# Patient Record
Sex: Male | Born: 1940 | Race: White | Hispanic: No | Smoking: Former smoker
Health system: Southern US, Community
[De-identification: ages and names within clinical notes are randomized; demographics above are authoritative.]

## PROBLEM LIST (undated history)

## (undated) DIAGNOSIS — F419 Anxiety disorder, unspecified: Secondary | ICD-10-CM

## (undated) DIAGNOSIS — I10 Essential (primary) hypertension: Secondary | ICD-10-CM

## (undated) DIAGNOSIS — E119 Type 2 diabetes mellitus without complications: Secondary | ICD-10-CM

## (undated) DIAGNOSIS — M109 Gout, unspecified: Secondary | ICD-10-CM

## (undated) DIAGNOSIS — C801 Malignant (primary) neoplasm, unspecified: Secondary | ICD-10-CM

## (undated) HISTORY — PX: NO PAST SURGERIES: SHX2092

## (undated) HISTORY — PX: MOLE REMOVAL: SHX2046

---

## 1998-08-17 ENCOUNTER — Ambulatory Visit (HOSPITAL_COMMUNITY): Admission: RE | Admit: 1998-08-17 | Discharge: 1998-08-17 | Payer: Self-pay | Admitting: Family Medicine

## 2001-02-05 ENCOUNTER — Encounter (INDEPENDENT_AMBULATORY_CARE_PROVIDER_SITE_OTHER): Payer: Self-pay | Admitting: *Deleted

## 2001-02-05 ENCOUNTER — Ambulatory Visit (HOSPITAL_COMMUNITY): Admission: RE | Admit: 2001-02-05 | Discharge: 2001-02-05 | Payer: Self-pay | Admitting: *Deleted

## 2004-03-15 ENCOUNTER — Encounter (INDEPENDENT_AMBULATORY_CARE_PROVIDER_SITE_OTHER): Payer: Self-pay | Admitting: *Deleted

## 2004-03-15 ENCOUNTER — Ambulatory Visit (HOSPITAL_COMMUNITY): Admission: RE | Admit: 2004-03-15 | Discharge: 2004-03-15 | Payer: Self-pay | Admitting: *Deleted

## 2009-08-16 ENCOUNTER — Ambulatory Visit (HOSPITAL_COMMUNITY): Admission: RE | Admit: 2009-08-16 | Discharge: 2009-08-16 | Payer: Self-pay | Admitting: Gastroenterology

## 2010-05-09 ENCOUNTER — Encounter: Admission: RE | Admit: 2010-05-09 | Discharge: 2010-05-09 | Payer: Self-pay | Admitting: Family Medicine

## 2010-10-22 LAB — GLUCOSE, CAPILLARY: Glucose-Capillary: 125 mg/dL — ABNORMAL HIGH (ref 70–99)

## 2016-04-17 ENCOUNTER — Other Ambulatory Visit: Payer: Self-pay | Admitting: Family Medicine

## 2016-04-18 ENCOUNTER — Other Ambulatory Visit: Payer: Self-pay | Admitting: Family Medicine

## 2016-04-19 ENCOUNTER — Other Ambulatory Visit: Payer: Self-pay | Admitting: Family Medicine

## 2016-04-19 DIAGNOSIS — Z136 Encounter for screening for cardiovascular disorders: Secondary | ICD-10-CM

## 2016-05-01 ENCOUNTER — Ambulatory Visit
Admission: RE | Admit: 2016-05-01 | Discharge: 2016-05-01 | Disposition: A | Discharge status: Home / Self Care | Payer: Medicare Other | Source: Ambulatory Visit | Attending: Family Medicine | Admitting: Family Medicine

## 2016-05-01 DIAGNOSIS — Z136 Encounter for screening for cardiovascular disorders: Secondary | ICD-10-CM

## 2016-11-22 ENCOUNTER — Observation Stay (HOSPITAL_COMMUNITY)
Admission: EM | Admit: 2016-11-22 | Discharge: 2016-11-23 | Disposition: A | Discharge status: Home / Self Care | Payer: Medicare Other | Attending: Internal Medicine | Admitting: Internal Medicine

## 2016-11-22 ENCOUNTER — Emergency Department (HOSPITAL_COMMUNITY): Payer: Medicare Other

## 2016-11-22 ENCOUNTER — Encounter (HOSPITAL_COMMUNITY): Payer: Self-pay | Admitting: Emergency Medicine

## 2016-11-22 DIAGNOSIS — I208 Other forms of angina pectoris: Principal | ICD-10-CM | POA: Insufficient documentation

## 2016-11-22 DIAGNOSIS — Z79899 Other long term (current) drug therapy: Secondary | ICD-10-CM | POA: Insufficient documentation

## 2016-11-22 DIAGNOSIS — F419 Anxiety disorder, unspecified: Secondary | ICD-10-CM | POA: Diagnosis not present

## 2016-11-22 DIAGNOSIS — Z87891 Personal history of nicotine dependence: Secondary | ICD-10-CM | POA: Insufficient documentation

## 2016-11-22 DIAGNOSIS — E119 Type 2 diabetes mellitus without complications: Secondary | ICD-10-CM | POA: Diagnosis not present

## 2016-11-22 DIAGNOSIS — R079 Chest pain, unspecified: Secondary | ICD-10-CM

## 2016-11-22 DIAGNOSIS — R0789 Other chest pain: Secondary | ICD-10-CM | POA: Diagnosis present

## 2016-11-22 DIAGNOSIS — Z7982 Long term (current) use of aspirin: Secondary | ICD-10-CM | POA: Diagnosis not present

## 2016-11-22 DIAGNOSIS — I10 Essential (primary) hypertension: Secondary | ICD-10-CM | POA: Diagnosis not present

## 2016-11-22 DIAGNOSIS — Z7984 Long term (current) use of oral hypoglycemic drugs: Secondary | ICD-10-CM | POA: Diagnosis not present

## 2016-11-22 DIAGNOSIS — M1A9XX0 Chronic gout, unspecified, without tophus (tophi): Secondary | ICD-10-CM | POA: Diagnosis not present

## 2016-11-22 DIAGNOSIS — N183 Chronic kidney disease, stage 3 (moderate): Secondary | ICD-10-CM | POA: Diagnosis not present

## 2016-11-22 DIAGNOSIS — E1122 Type 2 diabetes mellitus with diabetic chronic kidney disease: Secondary | ICD-10-CM

## 2016-11-22 DIAGNOSIS — M109 Gout, unspecified: Secondary | ICD-10-CM | POA: Diagnosis present

## 2016-11-22 HISTORY — DX: Essential (primary) hypertension: I10

## 2016-11-22 HISTORY — DX: Gout, unspecified: M10.9

## 2016-11-22 HISTORY — DX: Type 2 diabetes mellitus without complications: E11.9

## 2016-11-22 HISTORY — DX: Anxiety disorder, unspecified: F41.9

## 2016-11-22 LAB — COMPREHENSIVE METABOLIC PANEL
ALBUMIN: 4 g/dL (ref 3.5–5.0)
ALT: 28 U/L (ref 17–63)
AST: 26 U/L (ref 15–41)
Alkaline Phosphatase: 63 U/L (ref 38–126)
Anion gap: 12 (ref 5–15)
BILIRUBIN TOTAL: 0.8 mg/dL (ref 0.3–1.2)
BUN: 25 mg/dL — AB (ref 6–20)
CHLORIDE: 103 mmol/L (ref 101–111)
CO2: 21 mmol/L — ABNORMAL LOW (ref 22–32)
CREATININE: 1.68 mg/dL — AB (ref 0.61–1.24)
Calcium: 9.4 mg/dL (ref 8.9–10.3)
GFR calc Af Amer: 45 mL/min — ABNORMAL LOW (ref >60)
GFR, EST NON AFRICAN AMERICAN: 38 mL/min — AB (ref >60)
GLUCOSE: 174 mg/dL — AB (ref 65–99)
Potassium: 3.6 mmol/L (ref 3.5–5.1)
Sodium: 136 mmol/L (ref 135–145)
Total Protein: 6.5 g/dL (ref 6.5–8.1)

## 2016-11-22 LAB — I-STAT TROPONIN, ED: TROPONIN I, POC: 0 ng/mL (ref 0.00–0.08)

## 2016-11-22 LAB — CBC WITH DIFFERENTIAL/PLATELET
BASOS ABS: 0 10*3/uL (ref 0.0–0.1)
Basophils Relative: 0 %
Eosinophils Absolute: 0 10*3/uL (ref 0.0–0.7)
Eosinophils Relative: 0 %
HEMATOCRIT: 40.7 % (ref 39.0–52.0)
Hemoglobin: 13.5 g/dL (ref 13.0–17.0)
LYMPHS PCT: 13 %
Lymphs Abs: 1.2 10*3/uL (ref 0.7–4.0)
MCH: 28.5 pg (ref 26.0–34.0)
MCHC: 33.2 g/dL (ref 30.0–36.0)
MCV: 85.9 fL (ref 78.0–100.0)
Monocytes Absolute: 0.3 10*3/uL (ref 0.1–1.0)
Monocytes Relative: 4 %
NEUTROS ABS: 7.3 10*3/uL (ref 1.7–7.7)
NEUTROS PCT: 83 %
Platelets: 198 10*3/uL (ref 150–400)
RBC: 4.74 MIL/uL (ref 4.22–5.81)
RDW: 14.7 % (ref 11.5–15.5)
WBC: 8.8 10*3/uL (ref 4.0–10.5)

## 2016-11-22 NOTE — ED Provider Notes (Signed)
[facility] DEPT Provider Note   CSN: [id number] Arrival date & time: 11/22/16  2109     History   Chief Complaint Chief Complaint  Patient presents with  . Chest Pain    HPI Bryan Lloyd is a 76 y.o. male.  Patient states that he had chest pressure for 4 hours today with sweating and weakness. He states he never had this before and feels okay now   The history is provided by the patient. No language interpreter was used.  Chest Pain   This is a new problem. The current episode started 6 to 12 hours ago. The problem occurs rarely. The problem has been resolved. Associated with: Unknown. The pain is at a severity of 5/10. The pain is moderate. The quality of the pain is described as dull. Pertinent negatives include no abdominal pain, no back pain, no cough and no headaches.  Pertinent negatives for past medical history include no seizures.    Past Medical History:  Diagnosis Date  . Anxiety     There are no active problems to display for this patient.   History reviewed. No pertinent surgical history.     Home Medications    Prior to Admission medications   Medication Sig Start Date End Date Taking? Authorizing Provider  allopurinol (ZYLOPRIM) 300 MG tablet Take 300 mg by mouth daily.   Yes Historical Provider, MD  amLODipine (NORVASC) 10 MG tablet Take 10 mg by mouth daily.   Yes Historical Provider, MD  aspirin EC 81 MG tablet Take 81 mg by mouth daily.   Yes Historical Provider, MD  colchicine (COLCRYS) 0.6 MG tablet Take 0.6 mg by mouth daily as needed (for gout flares).   Yes Historical Provider, MD  escitalopram (LEXAPRO) 10 MG tablet Take 10 mg by mouth daily.   Yes Historical Provider, MD  glimepiride (AMARYL) 2 MG tablet Take 2 mg by mouth daily with breakfast.   Yes Historical Provider, MD  indapamide (LOZOL) 2.5 MG tablet Take 2.5 mg by mouth daily.   Yes Historical Provider, MD  LORazepam (ATIVAN) 0.5 MG tablet Take 0.5 mg by mouth daily as needed for  anxiety.   Yes Historical Provider, MD  metFORMIN (GLUCOPHAGE) 1000 MG tablet Take 1,000 mg by mouth 2 (two) times daily.   Yes Historical Provider, MD  metoprolol succinate (TOPROL-XL) 50 MG 24 hr tablet Take 50 mg by mouth daily. Take with or immediately following a meal.   Yes Historical Provider, MD  omeprazole (PRILOSEC) 20 MG capsule Take 20 mg by mouth daily before breakfast.   Yes Historical Provider, MD  pioglitazone (ACTOS) 30 MG tablet Take 30 mg by mouth daily.   Yes Historical Provider, MD  sildenafil (REVATIO) 20 MG tablet Take 20 mg by mouth daily as needed. AS DIRECTED    Historical Provider, MD    Family History No family history on file.  Social History Social History  Substance Use Topics  . Smoking status: Former Research scientist (life sciences)  . Smokeless tobacco: Never Used  . Alcohol use 0.6 oz/week    1 Cans of beer per week     Allergies   Patient has no known allergies.   Review of Systems Review of Systems  Constitutional: Negative for appetite change and fatigue.  HENT: Negative for congestion, ear discharge and sinus pressure.   Eyes: Negative for discharge.  Respiratory: Negative for cough.   Cardiovascular: Positive for chest pain.  Gastrointestinal: Negative for abdominal pain and diarrhea.  Genitourinary: Negative for  frequency and hematuria.  Musculoskeletal: Negative for back pain.  Skin: Negative for rash.  Neurological: Negative for seizures and headaches.  Psychiatric/Behavioral: Negative for hallucinations.     Physical Exam Updated Vital Signs BP 140/71   Pulse 76   Temp 97.9 F (36.6 C) (Oral)   Resp 13   Ht 5\' 6"  (1.676 m)   Wt 147 lb (66.7 kg)   SpO2 98%   BMI 23.73 kg/m   Physical Exam  Constitutional: He is oriented to person, place, and time. He appears well-developed.  HENT:  Head: Normocephalic.  Eyes: Conjunctivae and EOM are normal. No scleral icterus.  Neck: Neck supple. No thyromegaly present.  Cardiovascular: Normal rate and  regular rhythm.  Exam reveals no gallop and no friction rub.   No murmur heard. Pulmonary/Chest: No stridor. He has no wheezes. He has no rales. He exhibits no tenderness.  Abdominal: He exhibits no distension. There is no tenderness. There is no rebound.  Musculoskeletal: Normal range of motion. He exhibits no edema.  Lymphadenopathy:    He has no cervical adenopathy.  Neurological: He is oriented to person, place, and time. He exhibits normal muscle tone. Coordination normal.  Skin: No rash noted. No erythema.  Psychiatric: He has a normal mood and affect. His behavior is normal.     ED Treatments / Results  Labs (all labs ordered are listed, but only abnormal results are displayed) Labs Reviewed  COMPREHENSIVE METABOLIC PANEL - Abnormal; Notable for the following:       Result Value   CO2 21 (*)    Glucose, Bld 174 (*)    BUN 25 (*)    Creatinine, Ser 1.68 (*)    GFR calc non Af Amer 38 (*)    GFR calc Af Amer 45 (*)    All other components within normal limits  CBC WITH DIFFERENTIAL/PLATELET  I-STAT TROPOININ, ED    EKG  EKG Interpretation  Date/Time:  Thursday November 22 2016 21:18:51 EDT Ventricular Rate:  75 PR Interval:    QRS Duration: 99 QT Interval:  357 QTC Calculation: 399 R Axis:   76 Text Interpretation:  Sinus rhythm Ventricular premature complex Borderline T wave abnormalities Confirmed by Diedre Maclellan  MD, Broadus John [phone]) on 11/22/2016 9:38:02 PM Also confirmed by Jerika Wales  MD, Broadus John [phone])  on 11/22/2016 11:14:46 PM       Radiology Dg Chest 2 View  Result Date: 11/22/2016 CLINICAL DATA:  Central chest pain, shortness of breath, nausea and vomiting for 1 day. Former smoker. EXAM: CHEST  2 VIEW COMPARISON:  None. FINDINGS: Normal heart size and pulmonary vascularity. No focal airspace disease or consolidation in the lungs. No blunting of costophrenic angles. No pneumothorax. Mediastinal contours appear intact. Degenerative changes in the spine and shoulders.  IMPRESSION: No active cardiopulmonary disease. Electronically Signed   By: Lucienne Capers M.D.   On: 11/22/2016 22:16    Procedures Procedures (including critical care time)  Medications Ordered in ED Medications - No data to display   Initial Impression / Assessment and Plan / ED Course  I have reviewed the triage vital signs and the nursing notes.  Pertinent labs & imaging results that were available during my care of the patient were reviewed by me and considered in my medical decision making (see chart for details).     Patient with chest pain today for 4 hours. Labs unremarkable. Patient will be admitted for further evaluation  Final Clinical Impressions(s) / ED Diagnoses   Final diagnoses:  Essential hypertension  Stable angina pectoris [facility])    New Prescriptions New Prescriptions   No medications on file     Milton Ferguson, MD 11/22/16 2351

## 2016-11-22 NOTE — ED Triage Notes (Signed)
Per EMS: pt changed Mx recently. Pt states that today her got a tightness in his chest, tingling in both arms, N/V and dizziness. Pt took 3x81mg  of ASP today but vomited them up.    VSS NSR A&Ox4

## 2016-11-23 ENCOUNTER — Observation Stay (HOSPITAL_BASED_OUTPATIENT_CLINIC_OR_DEPARTMENT_OTHER): Payer: Medicare Other

## 2016-11-23 ENCOUNTER — Encounter (HOSPITAL_COMMUNITY): Payer: Self-pay | Admitting: Internal Medicine

## 2016-11-23 DIAGNOSIS — I208 Other forms of angina pectoris: Secondary | ICD-10-CM | POA: Diagnosis not present

## 2016-11-23 DIAGNOSIS — R079 Chest pain, unspecified: Secondary | ICD-10-CM

## 2016-11-23 DIAGNOSIS — Z7982 Long term (current) use of aspirin: Secondary | ICD-10-CM | POA: Diagnosis not present

## 2016-11-23 DIAGNOSIS — M109 Gout, unspecified: Secondary | ICD-10-CM | POA: Diagnosis present

## 2016-11-23 DIAGNOSIS — I10 Essential (primary) hypertension: Secondary | ICD-10-CM | POA: Diagnosis not present

## 2016-11-23 DIAGNOSIS — E0800 Diabetes mellitus due to underlying condition with hyperosmolarity without nonketotic hyperglycemic-hyperosmolar coma (NKHHC): Secondary | ICD-10-CM | POA: Diagnosis not present

## 2016-11-23 DIAGNOSIS — E119 Type 2 diabetes mellitus without complications: Secondary | ICD-10-CM | POA: Diagnosis not present

## 2016-11-23 DIAGNOSIS — F419 Anxiety disorder, unspecified: Secondary | ICD-10-CM | POA: Diagnosis present

## 2016-11-23 LAB — CBC
HCT: 39 % (ref 39.0–52.0)
HEMOGLOBIN: 12.9 g/dL — AB (ref 13.0–17.0)
MCH: 28.3 pg (ref 26.0–34.0)
MCHC: 33.1 g/dL (ref 30.0–36.0)
MCV: 85.5 fL (ref 78.0–100.0)
PLATELETS: 228 10*3/uL (ref 150–400)
RBC: 4.56 MIL/uL (ref 4.22–5.81)
RDW: 14.4 % (ref 11.5–15.5)
WBC: 7.8 10*3/uL (ref 4.0–10.5)

## 2016-11-23 LAB — BASIC METABOLIC PANEL
ANION GAP: 10 (ref 5–15)
BUN: 21 mg/dL — ABNORMAL HIGH (ref 6–20)
CALCIUM: 9.5 mg/dL (ref 8.9–10.3)
CO2: 25 mmol/L (ref 22–32)
Chloride: 105 mmol/L (ref 101–111)
Creatinine, Ser: 1.46 mg/dL — ABNORMAL HIGH (ref 0.61–1.24)
GFR, EST AFRICAN AMERICAN: 53 mL/min — AB (ref >60)
GFR, EST NON AFRICAN AMERICAN: 46 mL/min — AB (ref >60)
Glucose, Bld: 94 mg/dL (ref 65–99)
POTASSIUM: 3.6 mmol/L (ref 3.5–5.1)
SODIUM: 140 mmol/L (ref 135–145)

## 2016-11-23 LAB — GLUCOSE, CAPILLARY
GLUCOSE-CAPILLARY: 179 mg/dL — AB (ref 65–99)
GLUCOSE-CAPILLARY: 101 mg/dL — AB (ref 65–99)
GLUCOSE-CAPILLARY: 127 mg/dL — AB (ref 65–99)
Glucose-Capillary: 134 mg/dL — ABNORMAL HIGH (ref 65–99)
Glucose-Capillary: 156 mg/dL — ABNORMAL HIGH (ref 65–99)

## 2016-11-23 LAB — CREATININE, SERUM
CREATININE: 1.55 mg/dL — AB (ref 0.61–1.24)
GFR calc Af Amer: 49 mL/min — ABNORMAL LOW (ref >60)
GFR, EST NON AFRICAN AMERICAN: 42 mL/min — AB (ref >60)

## 2016-11-23 LAB — NM MYOCAR MULTI W/SPECT W/WALL MOTION / EF
CHL CUP MPHR: 146 {beats}/min
CHL CUP NUCLEAR SDS: 3
CHL CUP NUCLEAR SRS: 3
CHL CUP RESTING HR STRESS: 69 {beats}/min
CSEPPHR: 96 {beats}/min
LHR: 0.35
LV dias vol: 55 mL (ref 62–150)
LV sys vol: 12 mL
Percent HR: 65 %
SSS: 6
TID: 1.22

## 2016-11-23 LAB — LIPID PANEL
Cholesterol: 104 mg/dL (ref 0–200)
HDL: 44 mg/dL (ref >40)
LDL CALC: 52 mg/dL (ref 0–99)
TRIGLYCERIDES: 39 mg/dL (ref <150)
Total CHOL/HDL Ratio: 2.4 RATIO
VLDL: 8 mg/dL (ref 0–40)

## 2016-11-23 LAB — TROPONIN I

## 2016-11-23 LAB — MAGNESIUM
MAGNESIUM: 1.1 mg/dL — AB (ref 1.7–2.4)
Magnesium: 1.4 mg/dL — ABNORMAL LOW (ref 1.7–2.4)

## 2016-11-23 MED ORDER — COLCHICINE 0.6 MG PO TABS: 0.6000 mg | ORAL_TABLET | Freq: Every day | ORAL | 1 refills | Status: AC | PRN

## 2016-11-23 MED ORDER — PANTOPRAZOLE SODIUM 40 MG PO TBEC
40.0000 mg | DELAYED_RELEASE_TABLET | Freq: Every day | ORAL | Status: DC
  Administered 2016-11-23: 40 mg via ORAL
  Filled 2016-11-23: qty 1

## 2016-11-23 MED ORDER — INSULIN ASPART 100 UNIT/ML ~~LOC~~ SOLN: 0.0000 [IU] | SUBCUTANEOUS | Status: DC

## 2016-11-23 MED ORDER — ALLOPURINOL 300 MG PO TABS
300.0000 mg | ORAL_TABLET | Freq: Every day | ORAL | Status: DC
  Administered 2016-11-23: 300 mg via ORAL
  Filled 2016-11-23: qty 1

## 2016-11-23 MED ORDER — REGADENOSON 0.4 MG/5ML IV SOLN
INTRAVENOUS | Status: AC
  Filled 2016-11-23: qty 5

## 2016-11-23 MED ORDER — METOPROLOL SUCCINATE ER 50 MG PO TB24
50.0000 mg | ORAL_TABLET | Freq: Every day | ORAL | Status: DC
  Administered 2016-11-23: 50 mg via ORAL
  Filled 2016-11-23: qty 1

## 2016-11-23 MED ORDER — REGADENOSON 0.4 MG/5ML IV SOLN
0.4000 mg | Freq: Once | INTRAVENOUS | Status: AC
  Administered 2016-11-23: 0.4 mg via INTRAVENOUS
  Filled 2016-11-23: qty 5

## 2016-11-23 MED ORDER — GI COCKTAIL ~~LOC~~: 30.0000 mL | Freq: Four times a day (QID) | ORAL | Status: DC | PRN

## 2016-11-23 MED ORDER — AMLODIPINE BESYLATE 10 MG PO TABS
10.0000 mg | ORAL_TABLET | Freq: Every day | ORAL | Status: DC
  Administered 2016-11-23: 10 mg via ORAL
  Filled 2016-11-23: qty 1

## 2016-11-23 MED ORDER — ONDANSETRON HCL 4 MG/2ML IJ SOLN
4.0000 mg | Freq: Four times a day (QID) | INTRAMUSCULAR | Status: DC | PRN
Start: 2016-11-23 — End: 2016-11-23

## 2016-11-23 MED ORDER — SODIUM CHLORIDE 0.9 % IV SOLN
INTRAVENOUS | Status: AC
  Administered 2016-11-23: 02:00:00 via INTRAVENOUS

## 2016-11-23 MED ORDER — MAGNESIUM SULFATE 2 GM/50ML IV SOLN: 2.0000 g | Freq: Once | INTRAVENOUS | Status: DC

## 2016-11-23 MED ORDER — INSULIN ASPART 100 UNIT/ML ~~LOC~~ SOLN
0.0000 [IU] | SUBCUTANEOUS | Status: DC
  Administered 2016-11-23: 2 [IU] via SUBCUTANEOUS

## 2016-11-23 MED ORDER — MAGNESIUM OXIDE 400 (241.3 MG) MG PO TABS: 400.0000 mg | ORAL_TABLET | Freq: Two times a day (BID) | ORAL | 2 refills | Status: DC

## 2016-11-23 MED ORDER — LORAZEPAM 0.5 MG PO TABS
0.5000 mg | ORAL_TABLET | Freq: Every day | ORAL | Status: DC | PRN
  Administered 2016-11-23: 0.5 mg via ORAL
  Filled 2016-11-23: qty 1

## 2016-11-23 MED ORDER — TECHNETIUM TC 99M TETROFOSMIN IV KIT
10.0000 | PACK | Freq: Once | INTRAVENOUS | Status: AC | PRN
  Administered 2016-11-23: 10 via INTRAVENOUS

## 2016-11-23 MED ORDER — MORPHINE SULFATE (PF) 2 MG/ML IV SOLN: 2.0000 mg | INTRAVENOUS | Status: DC | PRN

## 2016-11-23 MED ORDER — ACETAMINOPHEN 325 MG PO TABS: 650.0000 mg | ORAL_TABLET | ORAL | Status: DC | PRN

## 2016-11-23 MED ORDER — ENOXAPARIN SODIUM 30 MG/0.3ML ~~LOC~~ SOLN
30.0000 mg | SUBCUTANEOUS | Status: DC
  Filled 2016-11-23: qty 0.3

## 2016-11-23 MED ORDER — MAGNESIUM OXIDE 400 (241.3 MG) MG PO TABS
400.0000 mg | ORAL_TABLET | Freq: Two times a day (BID) | ORAL | Status: DC
Start: 2016-11-23 — End: 2016-11-23
  Administered 2016-11-23: 400 mg via ORAL
  Filled 2016-11-23: qty 1

## 2016-11-23 MED ORDER — ASPIRIN EC 81 MG PO TBEC
81.0000 mg | DELAYED_RELEASE_TABLET | Freq: Every day | ORAL | Status: DC
  Administered 2016-11-23: 81 mg via ORAL
  Filled 2016-11-23: qty 1

## 2016-11-23 MED ORDER — INDAPAMIDE 2.5 MG PO TABS: 2.5000 mg | ORAL_TABLET | Freq: Every day | ORAL | 1 refills | Status: AC

## 2016-11-23 MED ORDER — TECHNETIUM TC 99M TETROFOSMIN IV KIT
30.0000 | PACK | Freq: Once | INTRAVENOUS | Status: AC | PRN
  Administered 2016-11-23: 30 via INTRAVENOUS

## 2016-11-23 NOTE — Progress Notes (Signed)
   Bryan Lloyd presented for a nuclear stress test today.  No immediate complications.  Stress imaging is pending at this time.  Preliminary EKG findings may be listed in the chart, but the stress test result will not be finalized until perfusion imaging is complete.  Tami Lin Suman Trivedi, PA-C 11/23/2016, 1:28 PM

## 2016-11-23 NOTE — Discharge Summary (Addendum)
Physician Discharge Summary  Bryan Lloyd MRN: [id number] DOB/AGE: [birth year] 74 y.o.  PCP: No PCP Per Patient   Admit date: 11/22/2016 Discharge date: 11/23/2016  Discharge Diagnoses:    Active Problems:   Chest pain at rest   HTN (hypertension)   Gout   Diabetes mellitus (HCC)   Anxiety   Stable angina pectoris (HCC)    Follow-up recommendations Follow-up with PCP in 3-5 days , including all  additional recommended appointments as below Follow-up CBC, CMP, magnesium in 3-5 days Metformin discontinued secondary to elevated creatinine Patient to discuss with PCP about continuation of lexapro       Current Discharge Medication List    START taking these medications   Details  magnesium oxide (MAG-OX) 400 (241.3 Mg) MG tablet Take 1 tablet (400 mg total) by mouth 2 (two) times daily. Qty: 60 tablet, Refills: 2      CONTINUE these medications which have NOT CHANGED   Details  allopurinol (ZYLOPRIM) 300 MG tablet Take 300 mg by mouth daily.    amLODipine (NORVASC) 10 MG tablet Take 10 mg by mouth daily.    aspirin EC 81 MG tablet Take 81 mg by mouth daily.    colchicine (COLCRYS) 0.6 MG tablet Take 0.6 mg by mouth daily as needed (for gout flares).    escitalopram (LEXAPRO) 10 MG tablet Take 10 mg by mouth daily.    glimepiride (AMARYL) 2 MG tablet Take 2 mg by mouth daily with breakfast.    indapamide (LOZOL) 2.5 MG tablet Take 2.5 mg by mouth daily.    LORazepam (ATIVAN) 0.5 MG tablet Take 0.5 mg by mouth daily as needed for anxiety.    metoprolol succinate (TOPROL-XL) 50 MG 24 hr tablet Take 50 mg by mouth daily. Take with or immediately following a meal.    omeprazole (PRILOSEC) 20 MG capsule Take 20 mg by mouth daily before breakfast.    pioglitazone (ACTOS) 30 MG tablet Take 30 mg by mouth daily.    sildenafil (REVATIO) 20 MG tablet Take 20 mg by mouth daily as needed. AS DIRECTED      STOP taking these medications     metFORMIN (GLUCOPHAGE) 1000 MG  tablet          Discharge Condition: Stable   Discharge Instructions Get Medicines reviewed and adjusted: Please take all your medications with you for your next visit with your Primary MD  Please request your Primary MD to go over all hospital tests and procedure/radiological results at the follow up, please ask your Primary MD to get all Hospital records sent to his/her office.  If you experience worsening of your admission symptoms, develop shortness of breath, life threatening emergency, suicidal or homicidal thoughts you must seek medical attention immediately by calling 911 or calling your MD immediately if symptoms less severe.  You must read complete instructions/literature along with all the possible adverse reactions/side effects for all the Medicines you take and that have been prescribed to you. Take any new Medicines after you have completely understood and accpet all the possible adverse reactions/side effects.   Do not drive when taking Pain medications.   Do not take more than prescribed Pain, Sleep and Anxiety Medications  Special Instructions: If you have smoked or chewed Tobacco in the last 2 yrs please stop smoking, stop any regular Alcohol and or any Recreational drug use.  Wear Seat belts while driving.  Please note  You were cared for by a hospitalist during your hospital stay. Once  you are discharged, your primary care physician will handle any further medical issues. Please note that NO REFILLS for any discharge medications will be authorized once you are discharged, as it is imperative that you return to your primary care physician (or establish a relationship with a primary care physician if you do not have one) for your aftercare needs so that they can reassess your need for medications and monitor your lab values.     No Known Allergies    Disposition:     Consults: Cardiology     Significant Diagnostic Studies:  Dg Chest 2 View  Result  Date: 11/22/2016 CLINICAL DATA:  Central chest pain, shortness of breath, nausea and vomiting for 1 day. Former smoker. EXAM: CHEST  2 VIEW COMPARISON:  None. FINDINGS: Normal heart size and pulmonary vascularity. No focal airspace disease or consolidation in the lungs. No blunting of costophrenic angles. No pneumothorax. Mediastinal contours appear intact. Degenerative changes in the spine and shoulders. IMPRESSION: No active cardiopulmonary disease. Electronically Signed   By: Lucienne Capers M.D.   On: 11/22/2016 22:16        Filed Weights   11/22/16 2115 11/23/16 0057  Weight: 66.7 kg (147 lb) 65.7 kg (144 lb 14.4 oz)        Labs: Results for orders placed or performed during the hospital encounter of 11/22/16 (from the past 48 hour(s))  CBC with Differential/Platelet     Status: None   Collection Time: 11/22/16  9:52 PM  Result Value Ref Range   WBC 8.8 4.0 - 10.5 K/uL   RBC 4.74 4.22 - 5.81 MIL/uL   Hemoglobin 13.5 13.0 - 17.0 g/dL   HCT 40.7 39.0 - 52.0 %   MCV 85.9 78.0 - 100.0 fL   MCH 28.5 26.0 - 34.0 pg   MCHC 33.2 30.0 - 36.0 g/dL   RDW 14.7 11.5 - 15.5 %   Platelets 198 150 - 400 K/uL   Neutrophils Relative % 83 %   Neutro Abs 7.3 1.7 - 7.7 K/uL   Lymphocytes Relative 13 %   Lymphs Abs 1.2 0.7 - 4.0 K/uL   Monocytes Relative 4 %   Monocytes Absolute 0.3 0.1 - 1.0 K/uL   Eosinophils Relative 0 %   Eosinophils Absolute 0.0 0.0 - 0.7 K/uL   Basophils Relative 0 %   Basophils Absolute 0.0 0.0 - 0.1 K/uL  Comprehensive metabolic panel     Status: Abnormal   Collection Time: 11/22/16  9:52 PM  Result Value Ref Range   Sodium 136 135 - 145 mmol/L   Potassium 3.6 3.5 - 5.1 mmol/L   Chloride 103 101 - 111 mmol/L   CO2 21 (L) 22 - 32 mmol/L   Glucose, Bld 174 (H) 65 - 99 mg/dL   BUN 25 (H) 6 - 20 mg/dL   Creatinine, Ser 1.68 (H) 0.61 - 1.24 mg/dL   Calcium 9.4 8.9 - 10.3 mg/dL   Total Protein 6.5 6.5 - 8.1 g/dL   Albumin 4.0 3.5 - 5.0 g/dL   AST 26 15 - 41 U/L    ALT 28 17 - 63 U/L   Alkaline Phosphatase 63 38 - 126 U/L   Total Bilirubin 0.8 0.3 - 1.2 mg/dL   GFR calc non Af Amer 38 (L) >60 mL/min   GFR calc Af Amer 45 (L) >60 mL/min    Comment: (NOTE) The eGFR has been calculated using the CKD EPI equation. This calculation has not been validated in all clinical situations.  eGFR's persistently <60 mL/min signify possible Chronic Kidney Disease.    Anion gap 12 5 - 15  I-stat troponin, ED     Status: None   Collection Time: 11/22/16  9:58 PM  Result Value Ref Range   Troponin i, poc 0.00 0.00 - 0.08 ng/mL   Comment 3            Comment: Due to the release kinetics of cTnI, a negative result within the first hours of the onset of symptoms does not rule out myocardial infarction with certainty. If myocardial infarction is still suspected, repeat the test at appropriate intervals.   Magnesium     Status: Abnormal   Collection Time: 11/23/16 12:57 AM  Result Value Ref Range   Magnesium 1.1 (L) 1.7 - 2.4 mg/dL  Glucose, capillary     Status: Abnormal   Collection Time: 11/23/16  1:38 AM  Result Value Ref Range   Glucose-Capillary 179 (H) 65 - 99 mg/dL  Troponin I-serum (0, 3, 6 hours)     Status: None   Collection Time: 11/23/16  2:20 AM  Result Value Ref Range   Troponin I <0.03 <0.03 ng/mL  CBC     Status: Abnormal   Collection Time: 11/23/16  2:20 AM  Result Value Ref Range   WBC 7.8 4.0 - 10.5 K/uL   RBC 4.56 4.22 - 5.81 MIL/uL   Hemoglobin 12.9 (L) 13.0 - 17.0 g/dL   HCT 39.0 39.0 - 52.0 %   MCV 85.5 78.0 - 100.0 fL   MCH 28.3 26.0 - 34.0 pg   MCHC 33.1 30.0 - 36.0 g/dL   RDW 14.4 11.5 - 15.5 %   Platelets 228 150 - 400 K/uL  Creatinine, serum     Status: Abnormal   Collection Time: 11/23/16  2:20 AM  Result Value Ref Range   Creatinine, Ser 1.55 (H) 0.61 - 1.24 mg/dL   GFR calc non Af Amer 42 (L) >60 mL/min   GFR calc Af Amer 49 (L) >60 mL/min    Comment: (NOTE) The eGFR has been calculated using the CKD EPI  equation. This calculation has not been validated in all clinical situations. eGFR's persistently <60 mL/min signify possible Chronic Kidney Disease.   Lipid panel     Status: None   Collection Time: 11/23/16  2:20 AM  Result Value Ref Range   Cholesterol 104 0 - 200 mg/dL   Triglycerides 39 <150 mg/dL   HDL 44 >40 mg/dL   Total CHOL/HDL Ratio 2.4 RATIO   VLDL 8 0 - 40 mg/dL   LDL Cholesterol 52 0 - 99 mg/dL    Comment:        Total Cholesterol/HDL:CHD Risk Coronary Heart Disease Risk Table                     Men   Women  1/2 Average Risk   3.4   3.3  Average Risk       5.0   4.4  2 X Average Risk   9.6   7.1  3 X Average Risk  23.4   11.0        Use the calculated Patient Ratio above and the CHD Risk Table to determine the patient's CHD Risk.        ATP III CLASSIFICATION (LDL):  <100     mg/dL   Optimal  100-129  mg/dL   Near or Above  Optimal  130-159  mg/dL   Borderline  160-189  mg/dL   High  >190     mg/dL   Very High   Troponin I-serum (0, 3, 6 hours)     Status: None   Collection Time: 11/23/16  5:25 AM  Result Value Ref Range   Troponin I <0.03 <0.03 ng/mL  Basic metabolic panel     Status: Abnormal   Collection Time: 11/23/16  5:25 AM  Result Value Ref Range   Sodium 140 135 - 145 mmol/L   Potassium 3.6 3.5 - 5.1 mmol/L   Chloride 105 101 - 111 mmol/L   CO2 25 22 - 32 mmol/L   Glucose, Bld 94 65 - 99 mg/dL   BUN 21 (H) 6 - 20 mg/dL   Creatinine, Ser 1.46 (H) 0.61 - 1.24 mg/dL   Calcium 9.5 8.9 - 10.3 mg/dL   GFR calc non Af Amer 46 (L) >60 mL/min   GFR calc Af Amer 53 (L) >60 mL/min    Comment: (NOTE) The eGFR has been calculated using the CKD EPI equation. This calculation has not been validated in all clinical situations. eGFR's persistently <60 mL/min signify possible Chronic Kidney Disease.    Anion gap 10 5 - 15  Glucose, capillary     Status: Abnormal   Collection Time: 11/23/16  5:59 AM  Result Value Ref Range    Glucose-Capillary 101 (H) 65 - 99 mg/dL  Glucose, capillary     Status: Abnormal   Collection Time: 11/23/16  7:50 AM  Result Value Ref Range   Glucose-Capillary 127 (H) 65 - 99 mg/dL  Glucose, capillary     Status: Abnormal   Collection Time: 11/23/16 11:18 AM  Result Value Ref Range   Glucose-Capillary 134 (H) 65 - 99 mg/dL     Lipid Panel     Component Value Date/Time   CHOL 104 11/23/2016 0220   TRIG 39 11/23/2016 0220   HDL 44 11/23/2016 0220   CHOLHDL 2.4 11/23/2016 0220   VLDL 8 11/23/2016 0220   LDLCALC 52 11/23/2016 0220     No results found for: HGBA1C   Lab Results  Component Value Date   LDLCALC 52 11/23/2016   CREATININE 1.46 (H) 11/23/2016     HPI :  76 y.o. male with medical history significant of Anxiety, gout, HTN, DM2 who presents with chest tightness.  He reports that today after eating he developed chest pressure across his chest which progressed to left arm numbness, cold sweating, nausea and vomiting  .He rated the chest pressure as a 5/10. He took ASA and called EMS. He states the pain resolved on his way to the ED. He was not short of breath. He currently denies chest pain and tightness, N/V, palpitations, and shortness of breath. He is active, walking every day, and is a former runner. He has been walking with regularity and has not had any chest pain with exertion. Of note, he recently started taking lexapro (approximately 1 week ago) for anxiety and has been having dizziness and diarrhea that he attributes to the new medication. He questions if this episode of chest tightness is related to the new lexapro regimen  HOSPITAL COURSE:  Chest pain typical vs. Atypical Cardiology was consulted to evaluate for ACS. Cardiac enzymes have been negative and EKG is without signs of ischemia. His pain resolved while with EMS. He denies exertional chest pain and SOB with his daily activities, including daily walking. This episode of chest tightness  is likely not  cardiac in nature; however, he has cardiac risk factors including HTN, DM, and is a former smoker  Continue baby aspirin  LDL 52, A1C pending   GI coctail;, zofran for nausea  nuclear study for risk stratification,results as follows.   There was no ST segment deviation noted during stress.  T wave inversion was noted during stress in the V1, V2, V3 and V4 leads.  The study is normal.  This is a low risk study.  The left ventricular ejection fraction is hyperdynamic (>65%).  Normal pharmacologic nuclear stress test with no evidence of prior infarct or ischemia.  No suspicion for PE clinically   AKI vs. CKD  Unknown baseline - he has HTN and DM, so could certainly be chronic Slight improvement since admission Held indapamide, metformin, colchicine   Metformin has been discontinued at the time of discharge  HTN (hypertension) - BP normotensive in the ED currently - Continue amlodipine and metoprolol  Gout - No acute flare - Continue allopurinol (he is on an okay dose for current renal function)    Diabetes mellitus Hemoglobin A1c pending Hold metformin and resume Amaryl and Actos      Anxiety - He thinks lexapro may be causative for his current symptoms, only on for 1 week   Discharge Exam:  Blood pressure 137/75, pulse 69, temperature 98.1 F (36.7 C), temperature source Oral, resp. rate 18, height '5\' 6"'$  (1.676 m), weight 65.7 kg (144 lb 14.4 oz), SpO2 98 %.  General: Pleasant, NAD Psych: Normal affect. Neuro: Alert and oriented X 3. Moves all extremities spontaneously. HEENT: Normal           Neck: Supple without bruits or JVD. Lungs:  Resp regular and unlabored, CTA. Heart: RRR no s3, s4, or murmurs. Abdomen: Soft, non-tender, non-distended, BS + x 4.  Extremities: No clubbing, cyanosis or edema. DP/PT/Radials 2+ and equal bilaterally.     Follow-up Information    Primary care provider. Call.   Why:  To make follow-up appointment in 3-5 days           Signed: Reyne Dumas 11/23/2016, 11:49 AM        Time spent >45 mins

## 2016-11-23 NOTE — Plan of Care (Signed)
Problem: Pain Managment: Goal: General experience of comfort will improve Outcome: Progressing Pt denies any pain  Problem: Tissue Perfusion: Goal: Risk factors for ineffective tissue perfusion will decrease Outcome: Progressing Pt currently on monitor. NSR. All VSS. Denies any CP or SOB. Continue to monitor.  Problem: Nutrition: Goal: Adequate nutrition will be maintained Outcome: Progressing Currently NPO for procedure. Monitor and treat Blood sugars Q4

## 2016-11-23 NOTE — Consult Note (Signed)
Cardiology Consult    Patient ID: JOAQUIM TOLEN MRN: [id number], DOB/AGE: October 04, 1940   Admit date: 11/22/2016 Date of Consult: 11/23/2016  Primary Physician: No PCP Per Patient Primary Cardiologist: new - Dr. Stanford Breed Requesting Provider: Dr. Allyson Sabal  Reason for Consult: chest pain  Patient Profile    Mr. Bellina is a 76 yo male with a PMH significant for HTN, DM, and anxiety. He presented to [facility] with chest pressure, tingling radiating down both arms, and N/V after eating.   ASH MCELWAIN is a 76 y.o. male who is being seen today for the evaluation of chest pain at the request of Dr. Allyson Sabal.    Past Medical History   Past Medical History:  Diagnosis Date  . Anxiety   . Diabetes mellitus ([place])   . Gout   . HTN (hypertension)     Past Surgical History:  Procedure Laterality Date  . NO PAST SURGERIES       Allergies  No Known Allergies  History of Present Illness    Mr. Hagemeister reports chest tightness that developed on 11/22/16 after eating. This chest discomfort radiated to his left arm; he was diaphoretic, nauseous and vomited. He rated the chest pressure as a 5/10. He took ASA and called EMS. He states the pain resolved on his way to the ED. He was not short of breath. He currently denies chest pain and tightness, N/V, palpitations, and shortness of breath. He is active, walking every day, and is a former runner. He has been walking with regularity and has not had any chest pain with exertion. Of note, he recently started taking lexapro (approximately 1 week ago) for anxiety and has been having dizziness and diarrhea that he attributes to the new medication. He questions if this episode of chest tightness is related to the new lexapro regimen. He is a former smoker and reports no previous cardiac problems other than hypertension.  Inpatient Medications    . allopurinol  300 mg Oral Daily  . amLODipine  10 mg Oral Daily  . aspirin EC  81 mg Oral Daily  . enoxaparin (LOVENOX) injection   30 mg Subcutaneous Q24H  . insulin aspart  0-9 Units Subcutaneous Q4H  . metoprolol succinate  50 mg Oral Daily  . pantoprazole  40 mg Oral Daily     Outpatient Medications    Prior to Admission medications   Medication Sig Start Date End Date Taking? Authorizing Provider  allopurinol (ZYLOPRIM) 300 MG tablet Take 300 mg by mouth daily.   Yes Historical Provider, MD  amLODipine (NORVASC) 10 MG tablet Take 10 mg by mouth daily.   Yes Historical Provider, MD  aspirin EC 81 MG tablet Take 81 mg by mouth daily.   Yes Historical Provider, MD  colchicine (COLCRYS) 0.6 MG tablet Take 0.6 mg by mouth daily as needed (for gout flares).   Yes Historical Provider, MD  escitalopram (LEXAPRO) 10 MG tablet Take 10 mg by mouth daily.   Yes Historical Provider, MD  glimepiride (AMARYL) 2 MG tablet Take 2 mg by mouth daily with breakfast.   Yes Historical Provider, MD  indapamide (LOZOL) 2.5 MG tablet Take 2.5 mg by mouth daily.   Yes Historical Provider, MD  LORazepam (ATIVAN) 0.5 MG tablet Take 0.5 mg by mouth daily as needed for anxiety.   Yes Historical Provider, MD  metFORMIN (GLUCOPHAGE) 1000 MG tablet Take 1,000 mg by mouth 2 (two) times daily.   Yes Historical Provider, MD  metoprolol succinate (  TOPROL-XL) 50 MG 24 hr tablet Take 50 mg by mouth daily. Take with or immediately following a meal.   Yes Historical Provider, MD  omeprazole (PRILOSEC) 20 MG capsule Take 20 mg by mouth daily before breakfast.   Yes Historical Provider, MD  pioglitazone (ACTOS) 30 MG tablet Take 30 mg by mouth daily.   Yes Historical Provider, MD  sildenafil (REVATIO) 20 MG tablet Take 20 mg by mouth daily as needed. AS DIRECTED    Historical Provider, MD     Family History     Family History  Problem Relation Age of Onset  . Diabetes Mellitus II Mother   . Colon cancer Father     Social History    Social History   Social History  . Marital status: Widowed    Spouse name: N/A  . Number of children: N/A  .  Years of education: N/A   Occupational History  . Not on file.   Social History Main Topics  . Smoking status: Former Research scientist (life sciences)  . Smokeless tobacco: Never Used     Comment: Quit 36 years ago  . Alcohol use 0.6 oz/week    1 Cans of beer per week  . Drug use: No  . Sexual activity: Not on file   Other Topics Concern  . Not on file   Social History Narrative  . No narrative on file     Review of Systems    General:  No chills, fever, night sweats or weight changes.  Cardiovascular:  No chest pain, dyspnea on exertion, edema, orthopnea, palpitations, paroxysmal nocturnal dyspnea. Dermatological: No rash, lesions/masses Respiratory: No cough, dyspnea Urologic: No hematuria, dysuria Abdominal:   No nausea, vomiting, diarrhea, bright red blood per rectum, melena, or hematemesis Neurologic:  No visual changes, changes in mental status. All other systems reviewed and are otherwise negative except as noted above.  Physical Exam    Blood pressure 127/71, pulse 69, temperature 98.4 F (36.9 C), temperature source Oral, resp. rate 16, height 5\' 6"  (1.676 m), weight 144 lb 14.4 oz (65.7 kg), SpO2 97 %.  General: Pleasant, NAD Psych: Normal affect. Neuro: Alert and oriented X 3. Moves all extremities spontaneously. HEENT: Normal  Neck: Supple without bruits or JVD. Lungs:  Resp regular and unlabored, CTA. Heart: RRR no s3, s4, or murmurs. Abdomen: Soft, non-tender, non-distended, BS + x 4.  Extremities: No clubbing, cyanosis or edema. DP/PT/Radials 2+ and equal bilaterally.  Labs    Troponin [facility] Test)  Recent Labs  11/22/16 2158  TROPIPOC 0.00    Recent Labs  11/23/16 0220 11/23/16 0525  TROPONINI <0.03 <0.03   Lab Results  Component Value Date   WBC 7.8 11/23/2016   HGB 12.9 (L) 11/23/2016   HCT 39.0 11/23/2016   MCV 85.5 11/23/2016   PLT 228 11/23/2016    Recent Labs Lab 11/22/16 2152  11/23/16 0525  NA 136  --  140  K 3.6  --  3.6  CL 103  --   105  CO2 21*  --  25  BUN 25*  --  21*  CREATININE 1.68*  < > 1.46*  CALCIUM 9.4  --  9.5  PROT 6.5  --   --   BILITOT 0.8  --   --   ALKPHOS 63  --   --   ALT 28  --   --   AST 26  --   --   GLUCOSE 174*  --  94  < > =  values in this interval not displayed. Lab Results  Component Value Date   CHOL 104 11/23/2016   HDL 44 11/23/2016   LDLCALC 52 11/23/2016   TRIG 39 11/23/2016   No results found for: [facility]   Radiology Studies    Dg Chest 2 View  Result Date: 11/22/2016 CLINICAL DATA:  Central chest pain, shortness of breath, nausea and vomiting for 1 day. Former smoker. EXAM: CHEST  2 VIEW COMPARISON:  None. FINDINGS: Normal heart size and pulmonary vascularity. No focal airspace disease or consolidation in the lungs. No blunting of costophrenic angles. No pneumothorax. Mediastinal contours appear intact. Degenerative changes in the spine and shoulders. IMPRESSION: No active cardiopulmonary disease. Electronically Signed   By: Lucienne Capers M.D.   On: 11/22/2016 22:16    ECG & Cardiac Imaging    EKG 11/23/16: NSR  Assessment & Plan    1. Chest pain - troponin x 2 negative - EKG without ST-T changes - continue ASA  2. HTN - continue home meds norvasc, toprol 50 mg  3. DM - per primary team - no recent A1c in record  4. Anxiety - recently started lexapro; I recommended that he call his PCP on Monday to discuss the side effects (e.g., diarrhea and dizziness) that he is experiencing   Mr. Greathouse was brought to [facility] via EMS for an episode of chest tightness and bilateral arm tingling after eating that resulted in diaphoresis, nausea and vomiting. Cardiology was consulted to evaluate for ACS. Cardiac enzymes have been negative and EKG is without signs of ischemia. His pain resolved while with EMS. He denies exertional chest pain and SOB with his daily activities, including daily walking. This episode of chest tightness is likely not cardiac in nature; however, he has  cardiac risk factors including HTN, DM, and is a former smoker. Recommend stress myoview.    Signed, Tami Lin Duke, PA-C 11/23/2016, 7:57 AM [id number]  As above, patient seen and examined. Briefly he is a 76 year old male with past medical history of diabetes mellitus, hypertension, anxiety and gout who I have been asked to evaluate by Dr. Allyson Sabal for CP. Patient has no prior cardiac history. He typically denies dyspnea on exertion or exertional chest pain. He was recently started on Lexapro and has had some nausea and diarrhea with this medication. After eating dinner last evening he developed nausea followed by chest tightness and bilateral upper extremity numbness. He also had diaphoresis and dizziness. His symptoms lasted approximately 2 hours and resolved spontaneously. He was admitted and cardiology asked to evaluate. Electrocardiogram shows no ST changes. Enzymes are negative. Hemoglobin 13.5. Creatinine 1.46.  1 chest pain-symptoms with both typical and atypical features. He has ruled out. This may have been GI related. We will arrange a nuclear study for risk stratification.  2 hypertension-blood pressure controlled. Continue present medications.  3 anxiety-Lexapro was recently initiated and he appears to be having side effects. He will discuss this with his primary care physician.  Kirk Ruths, MD

## 2016-11-23 NOTE — H&P (Signed)
History and Physical    Bryan Lloyd HKV:[id number] DOB: [birth year] DOA: 11/22/2016  PCP: Dr. Dierdre Forth Patient coming from: Home   Chief Complaint: Chest tightness  HPI: Bryan Lloyd is a 76 y.o. male with medical history significant of Anxiety, gout, HTN, DM2 who presents with chest tightness.  He reports that today after eating he developed chest pressure across his chest which progressed to left arm numbness, cold sweating, nausea and vomiting which contained food products.  The chest pressure persisted after vomiting which made him nervous, so he called EMS.  They advised him to chew an aspirin, which he initially vomited up, but then chewed up another which stayed down.  He had started feeling better when EMS arrived and did not require any further medications.  He denies SOB, movement of the pain to the back.  He did have some lightheadedness and dizziness.  He has off and on loose stools which are not new.  He is a former smoker.  He has no FH of chest pain or CAD.  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  He never has chest pain with activity.  He has a history of anxiety attacks, but this felt different than his normal anxiety.  Recently started a new medication - lexapro.  TIMI score - 1  ED Course:  In the ED, he was noted to have a glucose of 174, bicarb of 21 with AG of 12.  He had a Cr of 1.68 (unknown baseline). Initial troponin was 0.00.  EKG showed NSR with a PVC, some flattened TW.  No ST changes.  He had a CXR which showed no active disease.  On telemetry, he had NSR and occasional PVCs.  K was 3.6.    Review of Systems: As per HPI otherwise 10 point review of systems negative.    Past Medical History:  Diagnosis Date  . Anxiety   . Diabetes mellitus ([place])   . Gout   . HTN (hypertension)     Past Surgical History:  Procedure Laterality Date  . NO PAST SURGERIES     Reviewed with patient, quit 36 years ago.   reports that he has quit  smoking. He has never used smokeless tobacco. He reports that he drinks about 0.6 oz of alcohol per week . He reports that he does not use drugs.  No Known Allergies  Reviewed with patient.  Family History  Problem Relation Age of Onset  . Diabetes Mellitus II Mother   . Colon cancer Father     Prior to Admission medications   Medication Sig Start Date End Date Taking? Authorizing Provider  allopurinol (ZYLOPRIM) 300 MG tablet Take 300 mg by mouth daily.   Yes Historical Provider, MD  amLODipine (NORVASC) 10 MG tablet Take 10 mg by mouth daily.   Yes Historical Provider, MD  aspirin EC 81 MG tablet Take 81 mg by mouth daily.   Yes Historical Provider, MD  colchicine (COLCRYS) 0.6 MG tablet Take 0.6 mg by mouth daily as needed (for gout flares).   Yes Historical Provider, MD  escitalopram (LEXAPRO) 10 MG tablet - taking only 1 week.  Take 10 mg by mouth daily.   Yes Historical Provider, MD  glimepiride (AMARYL) 2 MG tablet Take 2 mg by mouth daily with breakfast.   Yes Historical Provider, MD  indapamide (LOZOL) 2.5 MG tablet Take 2.5 mg by mouth daily.   Yes Historical Provider, MD  LORazepam (ATIVAN) 0.5  MG tablet Take 0.5 mg by mouth daily as needed for anxiety.   Yes Historical Provider, MD  metFORMIN (GLUCOPHAGE) 1000 MG tablet Take 1,000 mg by mouth 2 (two) times daily.   Yes Historical Provider, MD  metoprolol succinate (TOPROL-XL) 50 MG 24 hr tablet Take 50 mg by mouth daily. Take with or immediately following a meal.   Yes Historical Provider, MD  omeprazole (PRILOSEC) 20 MG capsule Take 20 mg by mouth daily before breakfast.   Yes Historical Provider, MD  pioglitazone (ACTOS) 30 MG tablet Take 30 mg by mouth daily.   Yes Historical Provider, MD  sildenafil (REVATIO) 20 MG tablet Take 20 mg by mouth daily as needed. AS DIRECTED    Historical Provider, MD    Physical Exam: Vitals:   11/22/16 2300 11/22/16 2315 11/22/16 2330 11/23/16 0000  BP: (!) 151/86  140/71 133/73  Pulse:  85 78 76 76  Resp: 17 12 13 15   Temp:      TempSrc:      SpO2: 95% 97% 98% 92%  Weight:      Height:        Constitutional: Thin, elderly gentleman lying in bed, NAD Vitals:   11/22/16 2300 11/22/16 2315 11/22/16 2330 11/23/16 0000  BP: (!) 151/86  140/71 133/73  Pulse: 85 78 76 76  Resp: 17 12 13 15   Temp:      TempSrc:      SpO2: 95% 97% 98% 92%  Weight:      Height:       Eyes: PERRL, lids and conjunctivae normal, glasses in place ENMT: Mucous membranes are moist. Normal dentition Neck: normal, supple, no carotid bruit noted Respiratory: clear to auscultation bilaterally, no wheezing, no crackles. Normal respiratory effort.  Cardiovascular: Normal rate and Regular rhythm, no murmurs / rubs / gallops. No extremity edema. 2+ pulses radially Abdomen: +BS, NT, ND Musculoskeletal: no clubbing / cyanosis. Enlarged joints of the hands. Normal muscle tone.  Skin: no rashes, ulcers.  SKs on back Neurologic: No focal deficit, moving all extremities.  Psychiatric: Normal judgment and insight. Alert and oriented x 3. Normal mood.    Labs on Admission: I have personally reviewed following labs and imaging studies  CBC:  Recent Labs Lab 11/22/16 2152  WBC 8.8  NEUTROABS 7.3  HGB 13.5  HCT 40.7  MCV 85.9  PLT 130   Basic Metabolic Panel:  Recent Labs Lab 11/22/16 2152  NA 136  K 3.6  CL 103  CO2 21*  GLUCOSE 174*  BUN 25*  CREATININE 1.68*  CALCIUM 9.4   GFR: Estimated Creatinine Clearance: 34.8 mL/min (A) (by C-G formula based on SCr of 1.68 mg/dL (H)). Liver Function Tests:  Recent Labs Lab 11/22/16 2152  AST 26  ALT 28  ALKPHOS 63  BILITOT 0.8  PROT 6.5  ALBUMIN 4.0   No results for input(s): LIPASE, AMYLASE in the last 168 hours. No results for input(s): AMMONIA in the last 168 hours. Coagulation Profile: No results for input(s): INR, PROTIME in the last 168 hours. Cardiac Enzymes: No results for input(s): CKTOTAL, CKMB, CKMBINDEX, TROPONINI in  the last 168 hours. BNP (last 3 results) No results for input(s): PROBNP in the last 8760 hours. HbA1C: No results for input(s): HGBA1C in the last 72 hours. CBG: No results for input(s): GLUCAP in the last 168 hours. Lipid Profile: No results for input(s): CHOL, HDL, LDLCALC, TRIG, CHOLHDL, LDLDIRECT in the last 72 hours. Thyroid Function Tests: No results for input(s):  TSH, T4TOTAL, FREET4, T3FREE, THYROIDAB in the last 72 hours. Anemia Panel: No results for input(s): VITAMINB12, FOLATE, FERRITIN, TIBC, IRON, RETICCTPCT in the last 72 hours. Urine analysis: No results found for: COLORURINE, APPEARANCEUR, LABSPEC, PHURINE, GLUCOSEU, HGBUR, BILIRUBINUR, KETONESUR, PROTEINUR, UROBILINOGEN, NITRITE, LEUKOCYTESUR  Radiological Exams on Admission: Dg Chest 2 View  Result Date: 11/22/2016 CLINICAL DATA:  Central chest pain, shortness of breath, nausea and vomiting for 1 day. Former smoker. EXAM: CHEST  2 VIEW COMPARISON:  None. FINDINGS: Normal heart size and pulmonary vascularity. No focal airspace disease or consolidation in the lungs. No blunting of costophrenic angles. No pneumothorax. Mediastinal contours appear intact. Degenerative changes in the spine and shoulders. IMPRESSION: No active cardiopulmonary disease. Electronically Signed   By: Lucienne Capers M.D.   On: 11/22/2016 22:16    EKG: Independently reviewed. NSR with a PVC, some flattened TW.  Assessment/Plan Chest pain typical vs. Atypical - Mostly atypical features given time course.  But improvement with aspirin and risk factors make it reasonable to rule out for ACS - TnI X 1 negative, serial cardiac biomarkers - AM EKG - EKG for recurrent chest pain - Telemetry monitoring - Check Mag given frequent PVCs - Morphine and nitro for recurrent pain - Daily aspirin - Check lipid panel, A1C for risk stratification - GI coctail;, zofran for nausea - NPO, consider stress test in the AM  AKI vs. CKD - Unknown baseline - he  has HTN and DM, so could certainly be chronic - BMET in the AM - Check A1C - Hold renally dosed medications while NPO (held indapamide, metformin, colchicine) - IVF with NS at 75cc/hr overnight  HTN (hypertension) - BP normotensive in the ED currently - holding indapamide - Continue amlodipine and metoprolol  Gout - No acute flare - Continue allopurinol (he is on an okay dose for current renal function)    Diabetes mellitus - Check A1C - Hold glimepiride and actos given NPO, hold metformin given ? AKI - SSI - IVF at 75cc/hr overnight - CBG monitoring    Anxiety - He thinks lexapro may be causative for his current symptoms, only on for 1 week - Stop lexapro - Continue PRN lorazepam    DVT prophylaxis: Lovenox Code Status: Full NOK: Daughter, Debbie Disposition Plan: 23 hour admit for chest pain Consults called: None Admission status: Obs, telemetry   Gilles Chiquito MD [facility] Pager [phone]  If 7PM-7AM, please contact night-coverage [email or web address] Password [facility]  11/23/2016, 12:29 AM

## 2016-11-24 LAB — HEMOGLOBIN A1C
Hgb A1c MFr Bld: 7.3 % — ABNORMAL HIGH (ref 4.8–5.6)
MEAN PLASMA GLUCOSE: 163 mg/dL

## 2017-05-20 ENCOUNTER — Other Ambulatory Visit: Payer: Self-pay | Admitting: Family Medicine

## 2017-05-20 DIAGNOSIS — R0989 Other specified symptoms and signs involving the circulatory and respiratory systems: Secondary | ICD-10-CM

## 2017-09-16 ENCOUNTER — Other Ambulatory Visit: Payer: Self-pay | Admitting: Family Medicine

## 2017-09-16 DIAGNOSIS — R0989 Other specified symptoms and signs involving the circulatory and respiratory systems: Secondary | ICD-10-CM

## 2017-09-20 ENCOUNTER — Ambulatory Visit
Admission: RE | Admit: 2017-09-20 | Discharge: 2017-09-20 | Disposition: A | Discharge status: Home / Self Care | Payer: Medicare Other | Source: Ambulatory Visit | Attending: Family Medicine | Admitting: Family Medicine

## 2017-09-20 DIAGNOSIS — R0989 Other specified symptoms and signs involving the circulatory and respiratory systems: Secondary | ICD-10-CM

## 2017-09-25 ENCOUNTER — Other Ambulatory Visit: Payer: Self-pay

## 2017-09-25 DIAGNOSIS — I6521 Occlusion and stenosis of right carotid artery: Secondary | ICD-10-CM

## 2017-09-30 ENCOUNTER — Other Ambulatory Visit: Payer: Self-pay

## 2017-09-30 ENCOUNTER — Ambulatory Visit: Payer: Medicare Other | Admitting: Surgery

## 2017-09-30 ENCOUNTER — Other Ambulatory Visit (HOSPITAL_COMMUNITY): Payer: Self-pay | Admitting: Physician Assistant

## 2017-09-30 ENCOUNTER — Ambulatory Visit (HOSPITAL_COMMUNITY)
Admission: RE | Admit: 2017-09-30 | Discharge: 2017-09-30 | Disposition: A | Discharge status: Home / Self Care | Payer: Medicare Other | Source: Ambulatory Visit | Attending: Vascular Surgery | Admitting: Vascular Surgery

## 2017-09-30 ENCOUNTER — Encounter: Payer: Self-pay | Admitting: Surgery

## 2017-09-30 VITALS — BP 135/77 | HR 85 | Temp 97.1°F | Resp 18 | Ht 66.0 in | Wt 142.0 lb

## 2017-09-30 DIAGNOSIS — I6521 Occlusion and stenosis of right carotid artery: Secondary | ICD-10-CM | POA: Diagnosis not present

## 2017-09-30 DIAGNOSIS — R221 Localized swelling, mass and lump, neck: Secondary | ICD-10-CM

## 2017-09-30 LAB — VAS US CAROTID
RCCADSYS: -70 cm/s
RCCAPSYS: 76 cm/s
RIGHT CCA MID DIAS: -10 cm/s
RIGHT ECA DIAS: 9 cm/s
Right CCA prox dias: 9 cm/s

## 2017-09-30 NOTE — Progress Notes (Signed)
[facility]ist of [place]  Patient name: Bryan Lloyd MRN: [id number] DOB: [birth year] Sex: male   REQUESTING PROVIDER:    Marilynne Drivers   REASON FOR CONSULT:    Right carotid stenosis  HISTORY OF PRESENT ILLNESS:   Bryan Lloyd is a 77 y.o. male, who is referred today for evaluation of right carotid stenosis.  This was detected on ultrasound following auscultation of a carotid bruit.  The patient is asymptomatic.  Specifically, he denies numbness or weakness in either extremity.  He denies slurred speech.  He denies amaurosis fugax.  He does report an episode approximately 15 years ago where his left arm face and leg went numb.  This lasted approximately less than 5 minutes.  He has not had any other symptoms since then.  Patient suffers from diabetes.  His most recent hemoglobin A1c was 6.9.  He is managed medically for hypertension.  He has low cholesterol.  His most recent LDL was 53 and total cholesterol was 105.  He is not on a statin.  He is a former smoker but has quit many years ago  The patient denies any claudication symptoms.  He states he jogs 2 miles approximately 3 times a week and stays very active.  He tells me that he has been evaluated for coronary artery disease when he went to the hospital for chest pain in his workup was unremarkable.  PAST MEDICAL HISTORY    Past Medical History:  Diagnosis Date  . Anxiety   . Diabetes mellitus ([place])   . Gout   . HTN (hypertension)      FAMILY HISTORY   Family History  Problem Relation Age of Onset  . Diabetes Mellitus II Mother   . Colon cancer Father     SOCIAL HISTORY:   Social History   Socioeconomic History  . Marital status: Widowed    Spouse name: Not on file  . Number of children: Not on file  . Years of education: Not on file  . Highest education level: Not on file  Social Needs  . Financial resource strain: Not on file  . Food insecurity - worry: Not  on file  . Food insecurity - inability: Not on file  . Transportation needs - medical: Not on file  . Transportation needs - non-medical: Not on file  Occupational History  . Not on file  Tobacco Use  . Smoking status: Former Research scientist (life sciences)  . Smokeless tobacco: Never Used  . Tobacco comment: Quit 36 years ago  Substance and Sexual Activity  . Alcohol use: Yes    Alcohol/week: 0.6 oz    Types: 1 Cans of beer per week  . Drug use: No  . Sexual activity: Not on file  Other Topics Concern  . Not on file  Social History Narrative  . Not on file    ALLERGIES:    No Known Allergies  CURRENT MEDICATIONS:    Current Outpatient Medications  Medication Sig Dispense Refill  . allopurinol (ZYLOPRIM) 300 MG tablet Take 300 mg by mouth daily.    Marland Kitchen amLODipine (NORVASC) 10 MG tablet Take 10 mg by mouth daily.    Marland Kitchen aspirin EC 81 MG tablet Take 81 mg by mouth daily.    . colchicine (COLCRYS) 0.6 MG tablet Take 1 tablet (0.6 mg total) by mouth daily as needed (for gout flares). 30 tablet 1  . glimepiride (AMARYL) 2 MG tablet Take 2 mg by mouth daily with breakfast.    .  indapamide (LOZOL) 2.5 MG tablet Take 1 tablet (2.5 mg total) by mouth daily. 30 tablet 1  . LORazepam (ATIVAN) 0.5 MG tablet Take 0.5 mg by mouth daily as needed for anxiety.    . metFORMIN (GLUCOPHAGE) 1000 MG tablet Take 1,000 mg by mouth 2 (two) times daily with a meal.    . metoprolol succinate (TOPROL-XL) 50 MG 24 hr tablet Take 50 mg by mouth daily. Take with or immediately following a meal.    . omeprazole (PRILOSEC) 20 MG capsule Take 20 mg by mouth daily before breakfast.    . pioglitazone (ACTOS) 30 MG tablet Take 30 mg by mouth daily.    . sildenafil (REVATIO) 20 MG tablet Take 20 mg by mouth daily as needed. AS DIRECTED    . escitalopram (LEXAPRO) 10 MG tablet Take 10 mg by mouth daily.    . magnesium oxide (MAG-OX) 400 (241.3 Mg) MG tablet Take 1 tablet (400 mg total) by mouth 2 (two) times daily. (Patient not taking:  Reported on 09/30/2017) 60 tablet 2   No current facility-administered medications for this visit.     REVIEW OF SYSTEMS:   [X]  denotes positive finding, [ ]  denotes negative finding Cardiac  Comments:  Chest pain or chest pressure:    Shortness of breath upon exertion:    Short of breath when lying flat:    Irregular heart rhythm:        Vascular    Pain in calf, thigh, or hip brought on by ambulation: x   Pain in feet at night that wakes you up from your sleep:     Blood clot in your veins:    Leg swelling:         Pulmonary    Oxygen at home:    Productive cough:     Wheezing:         Neurologic    Sudden weakness in arms or legs:     Sudden numbness in arms or legs:     Sudden onset of difficulty speaking or slurred speech:    Temporary loss of vision in one eye:     Problems with dizziness:         Gastrointestinal    Blood in stool:      Vomited blood:         Genitourinary    Burning when urinating:     Blood in urine:        Psychiatric    Major depression:         Hematologic    Bleeding problems:    Problems with blood clotting too easily:        Skin    Rashes or ulcers:        Constitutional    Fever or chills:     PHYSICAL EXAM:   Vitals:   09/30/17 1454  BP: 135/77  Pulse: 85  Resp: 18  Temp: (!) 97.1 F (36.2 C)  TempSrc: Oral  SpO2: 96%  Weight: 142 lb (64.4 kg)  Height: 5\' 6"  (1.676 m)    GENERAL: The patient is a well-nourished male, in no acute distress. The vital signs are documented above. CARDIAC: There is a regular rate and rhythm.  VASCULAR: Palpable dorsalis pedis pulse bilaterally.  Right carotid bruit. PULMONARY: Nonlabored respirations ABDOMEN: Soft and non-tender with normal pitched bowel sounds.  MUSCULOSKELETAL: There are no major deformities or cyanosis. NEUROLOGIC: No focal weakness or paresthesias are detected. SKIN: There are no ulcers or  rashes noted. PSYCHIATRIC: The patient has a normal  affect.  STUDIES:   I have reviewed the outside ultrasound which shows 70-99% right carotid stenosis and no significant left carotid stenosis.  The ultrasound was repeated in the office today in the right side was in the 60-79% category.  End-diastolic velocities were 95.  ASSESSMENT and PLAN   Asymptomatic right carotid stenosis: I discussed the difference in ultrasound scales between the 2 studies.  I suspect that the patient's stenosis is less than 80%, and since he is asymptomatic, I would recommend nonoperative management at this time.  The only thing we could add to his medical regimen would be a statin.  We will is cholesterol profile is low and so not sure how much benefit he would get from this other than potentially raising his HDL.  I have encouraged him to have this discussion with his primary care physician.  We have also discussed the importance of continuing his exercise program and improving his nutritional status so that we can get his A1c lower.  I have the patient scheduled for a follow-up ultrasound in my office in 6 months.   Annamarie Major, MD [facility] Specialists of [facility] [phone] Pager [phone]

## 2017-10-04 ENCOUNTER — Other Ambulatory Visit: Payer: Self-pay | Admitting: Radiology

## 2017-10-07 ENCOUNTER — Other Ambulatory Visit: Payer: Self-pay | Admitting: Radiology

## 2017-10-07 ENCOUNTER — Other Ambulatory Visit: Payer: Self-pay | Admitting: Student

## 2017-10-08 ENCOUNTER — Ambulatory Visit (HOSPITAL_COMMUNITY)
Admission: RE | Admit: 2017-10-08 | Discharge: 2017-10-08 | Disposition: A | Discharge status: Home / Self Care | Payer: Medicare Other | Source: Ambulatory Visit | Attending: Physician Assistant | Admitting: Physician Assistant

## 2017-10-08 ENCOUNTER — Other Ambulatory Visit (HOSPITAL_COMMUNITY): Payer: Self-pay | Admitting: Physician Assistant

## 2017-10-08 DIAGNOSIS — R221 Localized swelling, mass and lump, neck: Secondary | ICD-10-CM

## 2017-10-08 DIAGNOSIS — C7989 Secondary malignant neoplasm of other specified sites: Secondary | ICD-10-CM | POA: Diagnosis not present

## 2017-10-08 DIAGNOSIS — C439 Malignant melanoma of skin, unspecified: Secondary | ICD-10-CM | POA: Diagnosis not present

## 2017-10-08 LAB — GLUCOSE, CAPILLARY: GLUCOSE-CAPILLARY: 142 mg/dL — AB (ref 65–99)

## 2017-10-08 MED ORDER — SODIUM CHLORIDE 0.9 % IV SOLN: INTRAVENOUS | Status: DC

## 2017-10-08 MED ORDER — LIDOCAINE HCL (PF) 1 % IJ SOLN
INTRAMUSCULAR | Status: AC
  Filled 2017-10-08: qty 30

## 2017-10-08 NOTE — Procedures (Signed)
  Procedure: US aspiration R neck nodule 25g x3 to cytology EBL:   minimal Complications:  none immediate  See full dictation in [facility].  Dillard Cannon MD Main # [phone] Pager  [phone]

## 2017-10-08 NOTE — Progress Notes (Signed)
Per Brynda Greathouse, PA for Radiology pt will not receive sedation so IV and labs not needed.

## 2017-10-10 ENCOUNTER — Encounter (HOSPITAL_COMMUNITY): Payer: Medicare Other

## 2017-10-11 ENCOUNTER — Encounter: Payer: Medicare Other | Admitting: Vascular Surgery

## 2018-01-14 ENCOUNTER — Telehealth: Payer: Self-pay | Admitting: Oncology

## 2018-01-14 ENCOUNTER — Encounter: Payer: Self-pay | Admitting: Oncology

## 2018-01-14 NOTE — Telephone Encounter (Signed)
New referral received from Dr. [name] at [email or web address]  for the pt to transfer care from [facility] for melanoma. Pt has been scheduled to see [facility]. [facility] on 6/20 at 2pm. Pt aware to arrive 30 minutes early. Letter mailed.

## 2018-01-23 ENCOUNTER — Telehealth: Payer: Self-pay

## 2018-01-23 ENCOUNTER — Inpatient Hospital Stay: Payer: Medicare Other | Attending: Oncology | Admitting: Oncology

## 2018-01-23 DIAGNOSIS — R5383 Other fatigue: Secondary | ICD-10-CM

## 2018-01-23 DIAGNOSIS — C439 Malignant melanoma of skin, unspecified: Secondary | ICD-10-CM

## 2018-01-23 DIAGNOSIS — F419 Anxiety disorder, unspecified: Secondary | ICD-10-CM | POA: Diagnosis not present

## 2018-01-23 DIAGNOSIS — E119 Type 2 diabetes mellitus without complications: Secondary | ICD-10-CM | POA: Diagnosis not present

## 2018-01-23 DIAGNOSIS — Z79899 Other long term (current) drug therapy: Secondary | ICD-10-CM

## 2018-01-23 DIAGNOSIS — Z87891 Personal history of nicotine dependence: Secondary | ICD-10-CM | POA: Diagnosis not present

## 2018-01-23 DIAGNOSIS — K7589 Other specified inflammatory liver diseases: Secondary | ICD-10-CM | POA: Diagnosis not present

## 2018-01-23 DIAGNOSIS — C4330 Malignant melanoma of unspecified part of face: Secondary | ICD-10-CM | POA: Insufficient documentation

## 2018-01-23 DIAGNOSIS — I10 Essential (primary) hypertension: Secondary | ICD-10-CM | POA: Diagnosis not present

## 2018-01-23 DIAGNOSIS — Z7984 Long term (current) use of oral hypoglycemic drugs: Secondary | ICD-10-CM

## 2018-01-23 NOTE — Progress Notes (Signed)
START ON PATHWAY REGIMEN - Melanoma     A cycle is every 28 days:     Nivolumab   **Always confirm dose/schedule in your pharmacy ordering system**  Patient Characteristics: Local Recurrence - Resected, BRAF V600  Wild Type / BRAF V600 Results Pending or Unknown Disease Subtype: Cutaneous Current Disease Status: Local Recurrence - Resected AJCC 8 Stage Grouping: III AJCC T Category: cT2 AJCC N Category: N2 AJCC M Category: M0 Mutation Status: Did Not Order BRAF V600 Test Intent of Therapy: Curative Intent, Discussed with Patient

## 2018-01-23 NOTE — Telephone Encounter (Signed)
Printed avs and calender of upcoming appointment. Per 6/20 los 

## 2018-01-23 NOTE — Progress Notes (Signed)
Reason for Referral: Melanoma  HPI: 77 year old man currently of [place] where he lived the majority of his life.  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  At that time he presented with a pigmented lesion measuring 8 mm in October 2018.  He was evaluated by Dr. Renda Rolls at [facility] dermatology and a biopsy confirmed the presence of 1.7 mm melanoma.  He subsequently underwent wide excision performed by Dr. Johney Maine at [facility] on July 01, 2017.  The final pathology showed no residual melanoma and 2- sentinel lymph node biopsies.  He started developing a palpable right cervical lymph node and underwent a fine-needle aspiration on October 08, 2017 which showed malignant cells at that time.  He subsequently was evaluated by Dr.Triozzi and started neoadjuvant nivolumab on on the first week of April.  He received nivolumab every 2 weeks throughout the month of April and the month of May and his last infusion which infusion #5 was given on Jan 01, 2017.  He received 480 mg at that time.  He tolerated therapy reasonably well up until his fifth treatment.  He did develop worsening fatigue and tiredness since that time.  He underwent surgical excision of his cervical lymph nodes on the right side on January 13, 2018 by Dr. Johney Maine.  He tolerated the procedure well without any complications.  The pathology showed metastatic melanoma and 5 of the 7 lymph nodes involved in the right cervical chain.  In the last week or so, he reported increase in his level of fatigue and tiredness.  He also reported some back discomfort and saw his primary care provider for this issue.  He had laboratory testing at that time and the results are not available to me at this time.  He did report a discoloration of the color of his urine but no skin jaundice.  He denied any vomiting but did report some nausea and poor p.o. intake.  He still able to  ambulate without any difficulties.  He denies any syncope or dizziness.     He does not report any headaches, blurry vision, or seizures. Does not report any fevers, chills or sweats.  Does not report any cough, wheezing or hemoptysis.  Does not report any chest pain, palpitation, orthopnea or leg edema.  Does not report any constipation, diarrhea or hematochezia.  He denies any mucus in his bowel movements.   He does report mild back pain but no hip pain or any other bone pain.    Does not report frequency, urgency or hematuria.  Does not report any skin rashes or lesions. Does not report any heat or cold intolerance.  Does not report any lymphadenopathy or petechiae.  Does not report any anxiety or depression.  Remaining review of systems is negative.    Past Medical History:  Diagnosis Date  . Anxiety   . Diabetes mellitus ([place])   . Gout   . HTN (hypertension)   :  Past Surgical History:  Procedure Laterality Date  . MOLE REMOVAL Right 07/01/2018    melanoma  surgery at [facility]  . NO PAST SURGERIES    :   Current Outpatient Medications:  .  allopurinol (ZYLOPRIM) 300 MG tablet, Take 300 mg by mouth daily., Disp: , Rfl:  .  amLODipine (NORVASC) 10 MG tablet, Take 10 mg by mouth daily., Disp: , Rfl:  .  aspirin EC 81 MG tablet, Take 81 mg by mouth daily.,  Disp: , Rfl:  .  colchicine (COLCRYS) 0.6 MG tablet, Take 1 tablet (0.6 mg total) by mouth daily as needed (for gout flares)., Disp: 30 tablet, Rfl: 1 .  cyclobenzaprine (FLEXERIL) 10 MG tablet, TAKE 1 TABLET BY MOUTH AS NEEDED THREE TIMES DAILY FOR 10 DAYS, Disp: , Rfl: 0 .  glimepiride (AMARYL) 2 MG tablet, Take 2 mg by mouth daily with breakfast., Disp: , Rfl:  .  indapamide (LOZOL) 2.5 MG tablet, Take 1 tablet (2.5 mg total) by mouth daily., Disp: 30 tablet, Rfl: 1 .  LORazepam (ATIVAN) 0.5 MG tablet, Take 0.5 mg by mouth daily as needed for anxiety., Disp: , Rfl:  .  metFORMIN (GLUCOPHAGE) 1000 MG tablet, Take 1,000 mg by  mouth 2 (two) times daily with a meal., Disp: , Rfl:  .  metoprolol succinate (TOPROL-XL) 50 MG 24 hr tablet, Take 50 mg by mouth daily. Take with or immediately following a meal., Disp: , Rfl:  .  omeprazole (PRILOSEC) 20 MG capsule, Take 20 mg by mouth daily before breakfast., Disp: , Rfl:  .  pioglitazone (ACTOS) 30 MG tablet, Take 30 mg by mouth daily., Disp: , Rfl:  .  sildenafil (REVATIO) 20 MG tablet, Take 40-100 mg by mouth daily as needed (erectile dysfunction). , Disp: , Rfl: :  No Known Allergies:  Family History  Problem Relation Age of Onset  . Diabetes Mellitus II Mother   . Colon cancer Father   :  Social History   Socioeconomic History  . Marital status: Widowed    Spouse name: Not on file  . Number of children: Not on file  . Years of education: Not on file  . Highest education level: Not on file  Occupational History  . Not on file  Social Needs  . Financial resource strain: Not on file  . Food insecurity:    Worry: Not on file    Inability: Not on file  . Transportation needs:    Medical: Not on file    Non-medical: Not on file  Tobacco Use  . Smoking status: Former Research scientist (life sciences)  . Smokeless tobacco: Never Used  . Tobacco comment: Quit 36 years ago  Substance and Sexual Activity  . Alcohol use: Yes    Alcohol/week: 0.6 oz    Types: 1 Cans of beer per week  . Drug use: No  . Sexual activity: Not on file  Lifestyle  . Physical activity:    Days per week: Not on file    Minutes per session: Not on file  . Stress: Not on file  Relationships  . Social connections:    Talks on phone: Not on file    Gets together: Not on file    Attends religious service: Not on file    Active member of club or organization: Not on file    Attends meetings of clubs or organizations: Not on file    Relationship status: Not on file  . Intimate partner violence:    Fear of current or ex partner: Not on file    Emotionally abused: Not on file    Physically abused: Not on  file    Forced sexual activity: Not on file  Other Topics Concern  . Not on file  Social History Narrative  . Not on file  :  Pertinent items are noted in HPI.  Exam: Blood pressure 128/79, pulse 100, temperature 98.4 F (36.9 C), temperature source Oral, resp. rate 18, height 5\' 6"  (1.676 m), weight  135 lb 14.4 oz (61.6 kg), SpO2 96 %.  ECOG 1 General appearance: alert and cooperative appeared without distress. Head: atraumatic without any abnormalities. Eyes: conjunctivae/corneas clear.  Extraocular muscle intact. Throat: lips, mucosa, and tongue normal; without oral thrush or ulcers. Resp: clear to auscultation bilaterally without rhonchi, wheezes or dullness to percussion. Cardio: regular rate and rhythm, S1, S2 normal, no murmur, click, rub or gallop GI: soft, non-tender; bowel sounds normal; no masses,  no organomegaly Skin: Well-healed scar noted in his right cervical chain.  No adenopathy or masses palpated. Lymph nodes: No adenopathy noted in the cervical, supraclavicular, or axillary lymph nodes. Neurologic: Grossly normal without any motor, sensory or deep tendon reflexes. Musculoskeletal: No joint deformity or effusion.  CBC    Component Value Date/Time   WBC 7.8 11/23/2016 0220   RBC 4.56 11/23/2016 0220   HGB 12.9 (L) 11/23/2016 0220   HCT 39.0 11/23/2016 0220   PLT 228 11/23/2016 0220   MCV 85.5 11/23/2016 0220   MCH 28.3 11/23/2016 0220   MCHC 33.1 11/23/2016 0220   RDW 14.4 11/23/2016 0220   LYMPHSABS 1.2 11/22/2016 2152   MONOABS 0.3 11/22/2016 2152   EOSABS 0.0 11/22/2016 2152   BASOSABS 0.0 11/22/2016 2152     Chemistry      Component Value Date/Time   NA 140 11/23/2016 0525   K 3.6 11/23/2016 0525   CL 105 11/23/2016 0525   CO2 25 11/23/2016 0525   BUN 21 (H) 11/23/2016 0525   CREATININE 1.46 (H) 11/23/2016 0525      Component Value Date/Time   CALCIUM 9.5 11/23/2016 0525   ALKPHOS 63 11/22/2016 2152   AST 26 11/22/2016 2152   ALT 28  11/22/2016 2152   BILITOT 0.8 11/22/2016 2152       Assessment and Plan:   77 year old man with the following:  1.  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  He presented with a melanotic lesion measuring 8 mm on his right face.  Biopsy showed a 1.7 mm tumor without ulceration.  He underwent wide excision and sentinel lymph node sampling completed in November 2018.  He developed relapsed disease in March 2019 with palpable cervical lymph nodes.  This was biopsy-proven to showed metastatic melanoma.  And subsequently underwent lymph node dissection of the right cervical chain with 5 out of 7 lymph nodes showed disease involvement.  He is currently receiving nivolumab in the neoadjuvant setting in the form of Nivolumab started in April 2019.  He received 5 doses with the last infusion was given on May 29 at 480 mg daily.  He is scheduled for his next infusion on 01/29/2018 to be given at [facility].  The natural course of this disease was reviewed with the patient as well as the role of adjuvant immunotherapy was discussed.  Complication associated with nivolumab to be administered on a monthly basis to complete a year of therapy was discussed.  These complications include increased fatigue, pruritus, skin rash as well as immune mediated complications.  These complications can include thyroid disease, hepatitis, pneumonitis, hypophysitis, arthritis among others.  The rationale for using immunotherapy in this particular setting was discussed and is agreeable to continue at this time.  He is scheduled to have his next infusion this week and we will tentatively schedule him to start infusion at the [facility] in [place] starting February 26, 2018 based on his wishes.  2.  Increased fatigue and discoloration of his  urine: These findings could be an early indication of possible autoimmune hepatitis associated with this therapy.  This could also be unrelated  event.  I recommended repeating his liver function test to assess for this possibility.  He has lab appointment scheduled prior to his next nivolumab infusion at [facility] and he will wait to have that done at this time.  His exam does not suggest any jaundice and I gave him clear instructions to report any problems between now and next week if they arise.  I reviewed the possibility of autoimmune hepatitis associated with this therapy which could result on treatment interruption and possible steroid use if it suspected to be related to nivolumab.  3.  Follow-up: Will be on July 24 before his next nivolumab infusion.  At that time we will recheck his liver function test as well as thyroid function test and assess his candidacy to continue with immunotherapy.  60  minutes was spent with the patient face-to-face today.  More than 50% of time was dedicated to patient counseling, education and coordination of his care.

## 2018-01-29 NOTE — Addendum Note (Signed)
Addended by: Wyatt Portela on: 01/29/2018 11:16 AM   Modules accepted: Orders

## 2018-01-30 ENCOUNTER — Telehealth: Payer: Self-pay | Admitting: Oncology

## 2018-01-30 ENCOUNTER — Other Ambulatory Visit: Payer: Self-pay | Admitting: *Deleted

## 2018-01-30 ENCOUNTER — Other Ambulatory Visit: Payer: Self-pay | Admitting: Oncology

## 2018-01-30 DIAGNOSIS — C439 Malignant melanoma of skin, unspecified: Secondary | ICD-10-CM

## 2018-01-30 NOTE — Telephone Encounter (Signed)
Scheduled appt per 6/27 sch message - pt is aware of appt date and time.

## 2018-01-30 NOTE — Progress Notes (Signed)
I was contacted by Dr. Baltazar Najjar regarding Bryan Lloyd.  He is receiving nivolumab on a monthly basis adjuvantly for the treatment of resected melanoma.  He was noted to have elevation in his liver function test with his LFTs on 01/29/2018 showed an AST of 390, ALT of 209 with elevated bilirubin.  It was recommended for the patient to have a CT scan done and I was asked to perform it locally.  His scan was urgently scheduled for 01/31/2018.  Based on the results of his CT scan, we will manage these findings accordingly.  He has hepatic metastasis, different anticancer treatment will be needed depending on his BRAF mutational status.  If his CT scan showed no metastatic disease in the liver or clear-cut obstruction, then hepatotoxicity related to nivolumab as suspected.  Based on these findings, will need to be treated with steroids orally with 60 mg daily for at least 30 days.  If he is symptomatic from these findings, he will require hospitalization for intravenous methylprednisone and switch to oral prednisone after that.

## 2018-01-31 ENCOUNTER — Other Ambulatory Visit: Payer: Self-pay

## 2018-01-31 ENCOUNTER — Encounter (HOSPITAL_COMMUNITY): Payer: Self-pay

## 2018-01-31 ENCOUNTER — Inpatient Hospital Stay (HOSPITAL_COMMUNITY)
Admission: EM | Admit: 2018-01-31 | Discharge: 2018-02-04 | DRG: 436 | Disposition: A | Discharge status: Home / Self Care | Payer: Medicare Other | Attending: Internal Medicine | Admitting: Internal Medicine

## 2018-01-31 ENCOUNTER — Inpatient Hospital Stay: Payer: Medicare Other

## 2018-01-31 ENCOUNTER — Telehealth: Payer: Self-pay

## 2018-01-31 ENCOUNTER — Other Ambulatory Visit (HOSPITAL_COMMUNITY): Payer: Self-pay

## 2018-01-31 ENCOUNTER — Ambulatory Visit (HOSPITAL_COMMUNITY)
Admission: RE | Admit: 2018-01-31 | Discharge: 2018-01-31 | Disposition: A | Discharge status: Home / Self Care | Payer: Medicare Other | Source: Ambulatory Visit | Attending: Oncology | Admitting: Oncology

## 2018-01-31 DIAGNOSIS — I10 Essential (primary) hypertension: Secondary | ICD-10-CM | POA: Diagnosis not present

## 2018-01-31 DIAGNOSIS — M4854XA Collapsed vertebra, not elsewhere classified, thoracic region, initial encounter for fracture: Secondary | ICD-10-CM

## 2018-01-31 DIAGNOSIS — C439 Malignant melanoma of skin, unspecified: Secondary | ICD-10-CM | POA: Insufficient documentation

## 2018-01-31 DIAGNOSIS — R740 Nonspecific elevation of levels of transaminase and lactic acid dehydrogenase [LDH]: Secondary | ICD-10-CM | POA: Diagnosis not present

## 2018-01-31 DIAGNOSIS — N400 Enlarged prostate without lower urinary tract symptoms: Secondary | ICD-10-CM

## 2018-01-31 DIAGNOSIS — R7989 Other specified abnormal findings of blood chemistry: Secondary | ICD-10-CM | POA: Diagnosis present

## 2018-01-31 DIAGNOSIS — K759 Inflammatory liver disease, unspecified: Secondary | ICD-10-CM | POA: Diagnosis present

## 2018-01-31 DIAGNOSIS — Z79899 Other long term (current) drug therapy: Secondary | ICD-10-CM

## 2018-01-31 DIAGNOSIS — M109 Gout, unspecified: Secondary | ICD-10-CM | POA: Diagnosis present

## 2018-01-31 DIAGNOSIS — E860 Dehydration: Secondary | ICD-10-CM | POA: Diagnosis not present

## 2018-01-31 DIAGNOSIS — E162 Hypoglycemia, unspecified: Secondary | ICD-10-CM | POA: Diagnosis not present

## 2018-01-31 DIAGNOSIS — C4330 Malignant melanoma of unspecified part of face: Secondary | ICD-10-CM | POA: Diagnosis not present

## 2018-01-31 DIAGNOSIS — K7460 Unspecified cirrhosis of liver: Secondary | ICD-10-CM | POA: Diagnosis present

## 2018-01-31 DIAGNOSIS — Z85820 Personal history of malignant melanoma of skin: Secondary | ICD-10-CM | POA: Diagnosis not present

## 2018-01-31 DIAGNOSIS — Z800 Family history of malignant neoplasm of digestive organs: Secondary | ICD-10-CM

## 2018-01-31 DIAGNOSIS — C7889 Secondary malignant neoplasm of other digestive organs: Secondary | ICD-10-CM | POA: Insufficient documentation

## 2018-01-31 DIAGNOSIS — D739 Disease of spleen, unspecified: Secondary | ICD-10-CM

## 2018-01-31 DIAGNOSIS — Z87891 Personal history of nicotine dependence: Secondary | ICD-10-CM | POA: Diagnosis not present

## 2018-01-31 DIAGNOSIS — Z833 Family history of diabetes mellitus: Secondary | ICD-10-CM | POA: Diagnosis not present

## 2018-01-31 DIAGNOSIS — Z7984 Long term (current) use of oral hypoglycemic drugs: Secondary | ICD-10-CM | POA: Diagnosis not present

## 2018-01-31 DIAGNOSIS — R945 Abnormal results of liver function studies: Secondary | ICD-10-CM | POA: Diagnosis not present

## 2018-01-31 DIAGNOSIS — E119 Type 2 diabetes mellitus without complications: Secondary | ICD-10-CM

## 2018-01-31 DIAGNOSIS — C787 Secondary malignant neoplasm of liver and intrahepatic bile duct: Secondary | ICD-10-CM

## 2018-01-31 DIAGNOSIS — E11649 Type 2 diabetes mellitus with hypoglycemia without coma: Secondary | ICD-10-CM | POA: Diagnosis present

## 2018-01-31 DIAGNOSIS — I700 Atherosclerosis of aorta: Secondary | ICD-10-CM | POA: Insufficient documentation

## 2018-01-31 DIAGNOSIS — F419 Anxiety disorder, unspecified: Secondary | ICD-10-CM | POA: Diagnosis present

## 2018-01-31 DIAGNOSIS — R112 Nausea with vomiting, unspecified: Secondary | ICD-10-CM

## 2018-01-31 DIAGNOSIS — K219 Gastro-esophageal reflux disease without esophagitis: Secondary | ICD-10-CM | POA: Diagnosis present

## 2018-01-31 DIAGNOSIS — R627 Adult failure to thrive: Secondary | ICD-10-CM | POA: Diagnosis not present

## 2018-01-31 DIAGNOSIS — E440 Moderate protein-calorie malnutrition: Secondary | ICD-10-CM | POA: Diagnosis present

## 2018-01-31 DIAGNOSIS — C799 Secondary malignant neoplasm of unspecified site: Secondary | ICD-10-CM

## 2018-01-31 DIAGNOSIS — K7589 Other specified inflammatory liver diseases: Secondary | ICD-10-CM | POA: Diagnosis not present

## 2018-01-31 DIAGNOSIS — R000 Tachycardia, unspecified: Secondary | ICD-10-CM | POA: Diagnosis present

## 2018-01-31 DIAGNOSIS — T451X5A Adverse effect of antineoplastic and immunosuppressive drugs, initial encounter: Secondary | ICD-10-CM | POA: Diagnosis present

## 2018-01-31 DIAGNOSIS — Z7982 Long term (current) use of aspirin: Secondary | ICD-10-CM

## 2018-01-31 DIAGNOSIS — E0800 Diabetes mellitus due to underlying condition with hyperosmolarity without nonketotic hyperglycemic-hyperosmolar coma (NKHHC): Secondary | ICD-10-CM

## 2018-01-31 DIAGNOSIS — R5383 Other fatigue: Secondary | ICD-10-CM | POA: Diagnosis not present

## 2018-01-31 HISTORY — DX: Malignant (primary) neoplasm, unspecified: C80.1

## 2018-01-31 LAB — COMPREHENSIVE METABOLIC PANEL
ALK PHOS: 509 U/L — AB (ref 38–126)
ALT: 170 U/L — ABNORMAL HIGH (ref 0–44)
AST: 357 U/L — ABNORMAL HIGH (ref 15–41)
Albumin: 2.9 g/dL — ABNORMAL LOW (ref 3.5–5.0)
Anion gap: 19 — ABNORMAL HIGH (ref 5–15)
BUN: 30 mg/dL — ABNORMAL HIGH (ref 8–23)
CALCIUM: 9.6 mg/dL (ref 8.9–10.3)
CHLORIDE: 93 mmol/L — AB (ref 98–111)
CO2: 24 mmol/L (ref 22–32)
Creatinine, Ser: 1.31 mg/dL — ABNORMAL HIGH (ref 0.61–1.24)
GFR calc non Af Amer: 52 mL/min — ABNORMAL LOW (ref >60)
GFR, EST AFRICAN AMERICAN: 60 mL/min — AB (ref >60)
Glucose, Bld: 85 mg/dL (ref 70–99)
Potassium: 4.3 mmol/L (ref 3.5–5.1)
SODIUM: 136 mmol/L (ref 135–145)
Total Bilirubin: 4.3 mg/dL — ABNORMAL HIGH (ref 0.3–1.2)
Total Protein: 6 g/dL — ABNORMAL LOW (ref 6.5–8.1)

## 2018-01-31 LAB — CBC WITH DIFFERENTIAL (CANCER CENTER ONLY)
Basophils Absolute: 0 10*3/uL (ref 0.0–0.1)
Basophils Relative: 0 %
EOS ABS: 0 10*3/uL (ref 0.0–0.5)
EOS PCT: 0 %
HCT: 44.7 % (ref 38.4–49.9)
Hemoglobin: 14.9 g/dL (ref 13.0–17.1)
LYMPHS ABS: 1.3 10*3/uL (ref 0.9–3.3)
Lymphocytes Relative: 10 %
MCH: 28.3 pg (ref 27.2–33.4)
MCHC: 33.3 g/dL (ref 32.0–36.0)
MCV: 85 fL (ref 79.3–98.0)
MONOS PCT: 9 %
Monocytes Absolute: 1.2 10*3/uL — ABNORMAL HIGH (ref 0.1–0.9)
Neutro Abs: 10.5 10*3/uL — ABNORMAL HIGH (ref 1.5–6.5)
Neutrophils Relative %: 81 %
PLATELETS: 183 10*3/uL (ref 140–400)
RBC: 5.26 MIL/uL (ref 4.20–5.82)
RDW: 17 % — AB (ref 11.0–14.6)
WBC Count: 13.1 10*3/uL — ABNORMAL HIGH (ref 4.0–10.3)

## 2018-01-31 LAB — URINALYSIS, ROUTINE W REFLEX MICROSCOPIC
Bacteria, UA: NONE SEEN
Glucose, UA: NEGATIVE mg/dL
Ketones, ur: 20 mg/dL — AB
LEUKOCYTES UA: NEGATIVE
NITRITE: NEGATIVE
PH: 5 (ref 5.0–8.0)
Protein, ur: NEGATIVE mg/dL
SPECIFIC GRAVITY, URINE: 1.045 — AB (ref 1.005–1.030)

## 2018-01-31 LAB — CBG MONITORING, ED
GLUCOSE-CAPILLARY: 61 mg/dL — AB (ref 70–99)
GLUCOSE-CAPILLARY: 159 mg/dL — AB (ref 70–99)
GLUCOSE-CAPILLARY: 131 mg/dL — AB (ref 70–99)
Glucose-Capillary: 54 mg/dL — ABNORMAL LOW (ref 70–99)

## 2018-01-31 LAB — LIPASE, BLOOD: LIPASE: 28 U/L (ref 11–51)

## 2018-01-31 MED ORDER — DEXTROSE 50 % IV SOLN
1.0000 | Freq: Once | INTRAVENOUS | Status: AC
  Administered 2018-01-31: 50 mL via INTRAVENOUS
  Filled 2018-01-31: qty 50

## 2018-01-31 MED ORDER — ONDANSETRON HCL 4 MG/2ML IJ SOLN: 4.0000 mg | Freq: Four times a day (QID) | INTRAMUSCULAR | Status: DC | PRN

## 2018-01-31 MED ORDER — METHOCARBAMOL 500 MG PO TABS
500.0000 mg | ORAL_TABLET | Freq: Three times a day (TID) | ORAL | Status: DC | PRN
  Administered 2018-01-31 – 2018-02-03 (×3): 500 mg via ORAL
  Filled 2018-01-31 (×3): qty 1

## 2018-01-31 MED ORDER — HYDROCODONE-ACETAMINOPHEN 5-325 MG PO TABS: 1.0000 | ORAL_TABLET | ORAL | Status: DC | PRN

## 2018-01-31 MED ORDER — ONDANSETRON HCL 4 MG/2ML IJ SOLN
4.0000 mg | Freq: Once | INTRAMUSCULAR | Status: AC
  Administered 2018-01-31: 4 mg via INTRAVENOUS
  Filled 2018-01-31: qty 2

## 2018-01-31 MED ORDER — IOPAMIDOL (ISOVUE-300) INJECTION 61%
100.0000 mL | Freq: Once | INTRAVENOUS | Status: AC | PRN
  Administered 2018-01-31: 100 mL via INTRAVENOUS

## 2018-01-31 MED ORDER — TRAMADOL HCL 50 MG PO TABS
50.0000 mg | ORAL_TABLET | Freq: Four times a day (QID) | ORAL | Status: DC | PRN
  Administered 2018-02-01: 50 mg via ORAL
  Filled 2018-01-31: qty 1

## 2018-01-31 MED ORDER — CHLORHEXIDINE GLUCONATE 0.12 % MT SOLN
15.0000 mL | Freq: Two times a day (BID) | OROMUCOSAL | Status: DC
  Administered 2018-01-31 – 2018-02-04 (×8): 15 mL via OROMUCOSAL
  Filled 2018-01-31 (×8): qty 15

## 2018-01-31 MED ORDER — POLYETHYLENE GLYCOL 3350 17 G PO PACK: 17.0000 g | PACK | Freq: Every day | ORAL | Status: DC | PRN

## 2018-01-31 MED ORDER — LORAZEPAM 0.5 MG PO TABS
0.5000 mg | ORAL_TABLET | Freq: Every day | ORAL | Status: DC | PRN
  Administered 2018-01-31 – 2018-02-01 (×2): 0.5 mg via ORAL
  Filled 2018-01-31 (×2): qty 1

## 2018-01-31 MED ORDER — ACETAMINOPHEN 325 MG PO TABS
650.0000 mg | ORAL_TABLET | Freq: Four times a day (QID) | ORAL | Status: DC | PRN
  Administered 2018-01-31: 650 mg via ORAL
  Filled 2018-01-31: qty 2

## 2018-01-31 MED ORDER — PANTOPRAZOLE SODIUM 40 MG PO TBEC
40.0000 mg | DELAYED_RELEASE_TABLET | Freq: Every day | ORAL | Status: DC
  Administered 2018-02-01 – 2018-02-04 (×4): 40 mg via ORAL
  Filled 2018-01-31 (×4): qty 1

## 2018-01-31 MED ORDER — ACETAMINOPHEN 650 MG RE SUPP: 650.0000 mg | Freq: Four times a day (QID) | RECTAL | Status: DC | PRN

## 2018-01-31 MED ORDER — IOPAMIDOL (ISOVUE-300) INJECTION 61%
INTRAVENOUS | Status: AC
  Filled 2018-01-31: qty 100

## 2018-01-31 MED ORDER — DEXTROSE-NACL 5-0.45 % IV SOLN
INTRAVENOUS | Status: DC
  Administered 2018-01-31 – 2018-02-02 (×3): via INTRAVENOUS

## 2018-01-31 MED ORDER — METOPROLOL TARTRATE 25 MG PO TABS
25.0000 mg | ORAL_TABLET | Freq: Two times a day (BID) | ORAL | Status: DC
  Administered 2018-01-31 – 2018-02-04 (×8): 25 mg via ORAL
  Filled 2018-01-31 (×8): qty 1

## 2018-01-31 MED ORDER — HYDROCODONE-ACETAMINOPHEN 5-325 MG PO TABS
1.0000 | ORAL_TABLET | ORAL | Status: DC | PRN
  Administered 2018-02-01 – 2018-02-03 (×2): 1 via ORAL
  Administered 2018-02-03: 2 via ORAL
  Filled 2018-01-31 (×2): qty 1
  Filled 2018-01-31: qty 2

## 2018-01-31 MED ORDER — ALLOPURINOL 300 MG PO TABS
300.0000 mg | ORAL_TABLET | Freq: Every day | ORAL | Status: DC
  Administered 2018-02-01 – 2018-02-04 (×4): 300 mg via ORAL
  Filled 2018-01-31 (×4): qty 1

## 2018-01-31 MED ORDER — SODIUM CHLORIDE 0.9 % IV BOLUS
1000.0000 mL | Freq: Once | INTRAVENOUS | Status: AC
  Administered 2018-01-31: 1000 mL via INTRAVENOUS

## 2018-01-31 MED ORDER — ONDANSETRON HCL 4 MG PO TABS
4.0000 mg | ORAL_TABLET | Freq: Four times a day (QID) | ORAL | Status: DC | PRN
  Administered 2018-02-04: 4 mg via ORAL
  Filled 2018-01-31: qty 1

## 2018-01-31 MED ORDER — ORAL CARE MOUTH RINSE
15.0000 mL | Freq: Two times a day (BID) | OROMUCOSAL | Status: DC
  Administered 2018-02-01 – 2018-02-04 (×7): 15 mL via OROMUCOSAL

## 2018-01-31 MED ORDER — ENOXAPARIN SODIUM 40 MG/0.4ML ~~LOC~~ SOLN
40.0000 mg | SUBCUTANEOUS | Status: DC
  Administered 2018-01-31 – 2018-02-03 (×4): 40 mg via SUBCUTANEOUS
  Filled 2018-01-31 (×4): qty 0.4

## 2018-01-31 MED ORDER — SENNA 8.6 MG PO TABS
1.0000 | ORAL_TABLET | Freq: Two times a day (BID) | ORAL | Status: DC
  Administered 2018-02-01 – 2018-02-03 (×3): 8.6 mg via ORAL
  Filled 2018-01-31 (×7): qty 1

## 2018-01-31 MED ORDER — INSULIN ASPART 100 UNIT/ML ~~LOC~~ SOLN
0.0000 [IU] | SUBCUTANEOUS | Status: DC
  Administered 2018-02-01: 3 [IU] via SUBCUTANEOUS
  Administered 2018-02-01: 5 [IU] via SUBCUTANEOUS
  Administered 2018-02-01: 2 [IU] via SUBCUTANEOUS
  Administered 2018-02-01: 3 [IU] via SUBCUTANEOUS
  Administered 2018-02-02: 1 [IU] via SUBCUTANEOUS
  Administered 2018-02-02: 3 [IU] via SUBCUTANEOUS
  Administered 2018-02-02: 2 [IU] via SUBCUTANEOUS
  Administered 2018-02-02: 1 [IU] via SUBCUTANEOUS
  Administered 2018-02-02: 2 [IU] via SUBCUTANEOUS
  Administered 2018-02-03 – 2018-02-04 (×5): 1 [IU] via SUBCUTANEOUS

## 2018-01-31 MED ORDER — ENSURE ENLIVE PO LIQD
237.0000 mL | Freq: Two times a day (BID) | ORAL | Status: DC
  Administered 2018-02-01 – 2018-02-04 (×6): 237 mL via ORAL

## 2018-01-31 NOTE — H&P (Addendum)
Bryan Lloyd ZOX:[id number] DOB: [birth year] DOA: 01/31/2018     PCP: [name], MD   [facility]     Oncology  Dr. Alen Blew,  Ihor Austin MD (Attending) [phone] (Work) [phone] [facility]) [address], [place] [address] Patient arrived to ER on 01/31/18 at 1542  Patient coming from:    home Lives alone,      Chief Complaint:  Chief Complaint  Patient presents with  . Nausea  . Weakness  . Emesis    HPI: Bryan Lloyd is a 77 y.o. male with medical history significant of diabetes, hypertension and  facial melanoma since 2018.       Presented with   nausea vomiting generalized weakness and abdominal pain for 2 weeks.  Unable to tolerate p.o. intake.  Melanoma initially diagnosed in October 2018 status post resection at [facility] in March 2019 noted to have biopsy of right cervical node positive for melanoma.  Started on neoadjuvant nivolumabPatient is status post 5 infusions of nivolumab.  Since May started to feel fatigued.  In June had his nodes resected.  He continues to have progressive generalized fatigue and darkened urine although no jaundice. 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 On June 24's found to have total bili of 4.1 alk phos 566 AST 390 ALT 209. 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  Labs rechecked today with total bilirubin   4.3 and AST 337  Patient endorsing continuous right upper quadrant pain. No diarrhea, has been so fatigued have not had energy to get out of bed. Had some back pain that has been on and off at times bothers him a lot.   Regarding pertinent  Chronic problems:.  History of diabetes on metformin, Amaryl and Actos History of hypertension on amlodipine   While in ER: Patient have not had much p.o. intake today secondary to undergoing CT scan on arrival to emergency departmen blood glucose down to 50s he was given D50  Following Medications were ordered in ER: Medications  sodium chloride 0.9 % bolus 1,000 mL (1,000 mLs Intravenous New Bag/Given 01/31/18 1806)  ondansetron (ZOFRAN) injection 4 mg (4 mg Intravenous Given 01/31/18 1806)  dextrose 50 % solution 50 mL (50 mLs Intravenous Given 01/31/18 1806)    Significant initial  Findings: Abnormal Labs Reviewed  CBG MONITORING, ED - Abnormal; Notable for the following components:      Result Value   Glucose-Capillary 61 (*)    All other components within normal limits  CBG MONITORING, ED - Abnormal; Notable for the following components:   Glucose-Capillary 54 (*)    All other components within normal limits     Na 136 K 4.3  Cr   stable Lab Results  Component Value Date   CREATININE 1.31 (H) 01/31/2018   CREATININE 1.46 (H) 11/23/2016   CREATININE 1.55 (H) 11/23/2016      WBC  13.1  HG/HCT  stable,      Component Value Date/Time   HGB 14.9 01/31/2018 1318   HCT 44.7 01/31/2018 1318         CTabd/pelvis -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  --      Excl. in [place]? --   TMAX(24)@       Latest  Blood pressure 124/79, pulse 99, temperature 97.9 F (36.6 C), temperature source Oral, resp. rate 18, height '5\' 6"'$  (1.676 m), weight 61.2 kg (135 lb), SpO2 93 %.    Hospitalist was called for admission for Dehydration hypoglycemia.   Review of Systems:    Pertinent positives include:  chills,fatigue,weight loss  nausea, vomiting (contrast solution) abdominal pain, back pain.   Constitutional:  No weight loss, night sweats, Fevers,   HEENT:  No headaches, Difficulty swallowing,Tooth/dental problems,Sore throat,  No sneezing, itching, ear ache, nasal congestion, post nasal drip,  Cardio-vascular:  No chest pain, Orthopnea, PND, anasarca, dizziness, palpitations.no Bilateral lower extremity swelling  GI:  No heartburn, indigestion,  diarrhea, change in bowel habits, loss of appetite, melena, blood in stool, hematemesis Resp:  no shortness of breath at rest. No dyspnea on exertion, No excess mucus, no productive cough, No non-productive cough, No coughing up of blood.No change in color of mucus.No wheezing. Skin:  no rash or lesions. No jaundice GU:  no dysuria, change in color of urine, no urgency or frequency. No straining to urinate.  No flank pain.  Musculoskeletal:  No joint pain or no joint swelling. No decreased range of motion. No  Psych:  No change in mood or affect. No depression or anxiety. No memory loss.  Neuro: no localizing neurological complaints, no tingling, no weakness, no double vision, no gait abnormality, no slurred speech, no confusion  As per HPI otherwise 10 point review of systems negative.   Past Medical History:   Past Medical History:  Diagnosis Date  . Anxiety   . Cancer ([place])    melanoma  . Diabetes mellitus ([place])   . Gout   . HTN (hypertension)       Past Surgical History:  Procedure Laterality Date  . MOLE REMOVAL Right 07/01/2018    melanoma  surgery at [facility]  . NO PAST  SURGERIES      Social History:  Ambulatory  independently      reports that he has quit smoking. He has never used smokeless tobacco. He reports that he drinks about 0.6 oz of alcohol per week. He reports that he does not use drugs.     Family History:   Family History  Problem Relation Age of Onset  . Diabetes Mellitus II Mother   . Colon cancer Father     Allergies: No Known Allergies   Prior to Admission medications   Medication Sig Start Date End Date Taking? Authorizing Provider  allopurinol (ZYLOPRIM) 300 MG  tablet Take 300 mg by mouth daily.    [provider]  amLODipine (NORVASC) 10 MG tablet Take 10 mg by mouth daily.    [provider]  aspirin EC 81 MG tablet Take 81 mg by mouth daily.    [provider]  colchicine (COLCRYS) 0.6 MG tablet Take 1 tablet (0.6 mg total) by mouth daily as needed (for gout flares). 11/28/16   Reyne Dumas, MD  cyclobenzaprine (FLEXERIL) 10 MG tablet TAKE 1 TABLET BY MOUTH AS NEEDED THREE TIMES DAILY FOR 10 DAYS 01/17/18   [provider]  glimepiride (AMARYL) 2 MG tablet Take 2 mg by mouth daily with breakfast.    [provider]  HYDROcodone-acetaminophen (NORCO/VICODIN) 5-325 MG tablet Take 1-2 tablets by mouth every 4 (four) hours as needed. 01/29/18   [provider]  indapamide (LOZOL) 2.5 MG tablet Take 1 tablet (2.5 mg total) by mouth daily. 11/26/16   Reyne Dumas, MD  LORazepam (ATIVAN) 0.5 MG tablet Take 0.5 mg by mouth daily as needed for anxiety.    [provider]  metFORMIN (GLUCOPHAGE) 1000 MG tablet Take 1,000 mg by mouth 2 (two) times daily with a meal.    [provider]  metoprolol succinate (TOPROL-XL) 50 MG 24 hr tablet Take 50 mg by mouth daily. Take with or immediately following a meal.    [provider]  omeprazole (PRILOSEC) 20 MG capsule Take 20 mg by mouth daily before breakfast.    [provider]  ondansetron (ZOFRAN) 8 MG  tablet Take 8 mg by mouth every 8 (eight) hours as needed. 01/29/18   [provider]  pioglitazone (ACTOS) 30 MG tablet Take 30 mg by mouth daily.    [provider]  sildenafil (REVATIO) 20 MG tablet Take 40-100 mg by mouth daily as needed (erectile dysfunction).     [provider]   Physical Exam: Blood pressure 124/79, pulse 99, temperature 97.9 F (36.6 C), temperature source Oral, resp. rate 18, height '5\' 6"'$  (1.676 m), weight 61.2 kg (135 lb), SpO2 93 %. 1. General:  in No Acute distress  Chronically ill -appearing 2. Psychological: Alert and   Oriented 3. Head/ENT:    Dry Mucous Membranes                          Head Non traumatic, neck supple                          Poor Dentition 4. SKIN: decreased Skin turgor,  Skin clean Dry and intact no rash 5. Heart: Regular rate and rhythm no Murmur, no Rub or gallop 6. Lungs:  Clear to auscultation bilaterally, no wheezes or crackles   7. Abdomen: Soft, non-tender, Non distended bowel sounds present 8. Lower extremities: no clubbing, cyanosis, or edema 9. Neurologically Grossly intact  strength 5 out of 5 in all 4 extremities cranial nerves II through XII intact 10. MSK: Normal range of motion   LABS:     Recent Labs  Lab 01/31/18 1318  WBC 13.1*  NEUTROABS 10.5*  HGB 14.9  HCT 44.7  MCV 85.0  PLT 388   Basic Metabolic Panel: Recent Labs  Lab 01/31/18 1310  NA 136  K 4.3  CL 93*  CO2 24  GLUCOSE 85  BUN 30*  CREATININE 1.31*  CALCIUM 9.6      Recent Labs  Lab 01/31/18 1310  AST 357*  ALT 170*  ALKPHOS 509*  BILITOT 4.3*  PROT 6.0*  ALBUMIN 2.9*   No results for input(s): LIPASE, AMYLASE in the last 168 hours. No results for input(s): AMMONIA in the last 168 hours.    HbA1C: No results for input(s): HGBA1C in the last 72 hours. CBG: Recent Labs  Lab 01/31/18 1604 01/31/18 1759  GLUCAP 61* 54*      Urine analysis: No results found for: COLORURINE, APPEARANCEUR,  LABSPEC, PHURINE, GLUCOSEU, HGBUR, BILIRUBINUR, KETONESUR, PROTEINUR, UROBILINOGEN, NITRITE, LEUKOCYTESUR     Cultures: No results found for: SDES, SPECREQUEST, CULT, REPTSTATUS   Radiological Exams on Admission: Ct Abdomen Pelvis W Contrast  Result Date: 01/31/2018 CLINICAL DATA:  History of melanoma, receiving Nivolumab, recent elevation in liver function tests. EXAM: CT ABDOMEN AND PELVIS WITH CONTRAST TECHNIQUE: Multidetector CT imaging of the abdomen and pelvis was performed using the standard protocol following bolus administration of intravenous contrast. CONTRAST:  146m ISOVUE-300 IOPAMIDOL (ISOVUE-300) INJECTION 61% COMPARISON:  None. FINDINGS: Lower chest: Three-vessel coronary artery atherosclerosis. Fluid density crescentic structure adjacent to the distal esophagus at the hiatus on image 19/2, measuring 1.7 by 1.6 cm, significance uncertain. Hepatobiliary: There is diffuse nodularity of the liver compatible with cirrhosis or pseudo cirrhosis. Borderline gallbladder wall thickening. Pancreas: Unremarkable Spleen: Extensive hypodense nodularity of variable size lesions throughout the spleen. An index lesion inferiorly in the spleen measures 2.8 by 2.4 cm on image 34/2. Adrenals/Urinary Tract: The adrenal glands appear normal. Two dominant fluid density cysts of the right kidney are noted. Stomach/Bowel: Descending and sigmoid colon diverticulosis without compelling findings of metastatic disease to bowel. Appendix normal. Vascular/Lymphatic: Aortoiliac atherosclerotic vascular disease. A porta hepatis lymph node just above the pancreas measures 1.2 cm in short axis on image 34/2, and could be reactive or malignant. Reproductive: The prostate gland measures 5.7 by 4.7 by 4.7 cm (volume = 66 cm^3). There is subcutaneous edema in the pubis extending into the scrotum and potentially the penis. Other: Trace fluid in the right paracolic gutter. Small amount of free pelvic fluid, image 83/2.  Musculoskeletal: The confluent edema along the pubis also extends to overlie the right hip region laterally. 30% superior endplate compression fracture at T12, potentially chronic or late subacute. Lower lumbar degenerative facet arthropathy. IMPRESSION: 1. Diffuse low-density nodularity throughout the liver along with variable-sized diffuse low-density nodularity in the spleen. 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 Alternatively this could be true cirrhosis with either splenic metastatic disease or other causes of multiple splenic nodules to include lymphoid neoplasm, Littoral cell angioma, or less likely sarcoidosis. Workup pathways may include tissue diagnosis/biopsy or nuclear medicine PET-CT. 2. Mildly prominent porta hepatis lymph node could be reactive or neoplastic. 3. Abnormal confluent subcutaneous edema along the pubis extending into the scrotum and penis, cause uncertain. Subcutaneous edema also extends lateral to the right hip. 4. Small amount of free pelvic fluid, etiology uncertain. 5. Borderline gallbladder wall thickening. 6.  Aortic Atherosclerosis (ICD10-I70.0). 7. Mild prostatomegaly. 8. 30% superior endplate compression fracture of T12, probably chronic or late subacute. Electronically Signed   By: WVan ClinesM.D.   On: 01/31/2018 16:16    Chart has been reviewed    Assessment/Plan  77y.o. male with medical history significant of diabetes, hypertension and  facial melanoma since 2018.   Admitted for dehydration  Present on Admission: . 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 to discuss further management options Tachycardia -restart metoprolol  with holding parameters to avoid rebound tachycardia rehydrate and monitor on telemetry overnight no chest pain or shortness of breath  . HTN (hypertension) -secondary to dehydration we will hold home medications for  tonight restart when able . Hypoglycemia secondary to decreased p.o. intake will hold home medications and monitor frequent blood sugars . Elevated LFTs most likely secondary to metastatic spread to the liver discussed with oncology at this point feeling the immuno therapy and use hepatitis B and less likely will hold off on steroids . Dehydration - rehydrate with IV fluids DM2-  - Order Sensitive   SSI     -  check TSH and HgA1C  - Hold by mouth medications   T12 compression fracture on physical exam pain is more in the lumbar region, not corresponding to T12.  Will order PRN pain medication at this point patient is not interested in physical therapy but may benefit from it in the future. T12 compression fracture unknown acuity unclear if contributing to current back pain  Other plan as per orders.  DVT prophylaxis:   Lovenox     Code Status:  FULL CODE as per patient  I had personally discussed CODE STATUS with patient    Family Communication:   Family not  at  Bedside   Disposition Plan:      To home once workup is complete and patient is stable                         Would benefit from PT/OT eval prior to DC at this time refused but would readdress  Nutrition     consulted                          Consults called: spoke to oncology   Admission status:   inpatient     Level of care   tele         Toy Baker 01/31/2018, 7:41 PM    [facility]  Pager [phone]   after 2 AM please page floor coverage PA If 7AM-7PM, please contact the day team taking care of the patient  [email or web address]  Password [email or web address]

## 2018-01-31 NOTE — Progress Notes (Unsigned)
CRITICAL AST 357 AND CRITICAL TOTAL BILIRUBIN 4.3 WERE GIVEN TO Seth Bake MUSA (RN) BY MARSHA KERR(MT) 01/31/2018 1448PM

## 2018-01-31 NOTE — ED Triage Notes (Addendum)
Patient reports that he was in CT today. Patient reports that he became nauseated and feeling weak. Patient was brought in to the ED by CT staff. Patient had an IV in the left [facility]. Patient also reports that he had blood work already done today.  Patient actively vomiting in triage

## 2018-01-31 NOTE — ED Notes (Signed)
Unable to collect labs because patient want them collect from the IV

## 2018-01-31 NOTE — ED Provider Notes (Signed)
[place] DEPT Provider Note   CSN: [id number] Arrival date & time: 01/31/18  1542     History   Chief Complaint Chief Complaint  Patient presents with  . Nausea  . Weakness  . Emesis    HPI Bryan Lloyd is a 77 y.o. male.  HPI Patient has had nausea vomiting generalized weakness and abdominal pain.  Has had for the last couple weeks.  Right upper quadrant abdominal pain.  PCP drew labs and showed LFTs elevated.  Followed up with oncology since he sees them for metastatic melanoma.  Has been on nivolumab for around 3 months now.  Per oncology there is question whether this was hepatotoxicity versus metastatic disease.  CT scan done today and shows findings in both the liver and spleen.  Presumed to be likely metastatic disease but not feeling.possible diagnosis.  Patient continues to have nausea and vomiting.  He has not been able to have much oral intake and has not eaten today also due to the work-up that is been done. Past Medical History:  Diagnosis Date  . Anxiety   . Cancer ([place])    melanoma  . Diabetes mellitus ([place])   . Gout   . HTN (hypertension)     Patient Active Problem List   Diagnosis Date Noted  . Melanoma of skin ([place]) 01/23/2018  . Chest pain at rest 11/23/2016  . HTN (hypertension)   . Gout   . Diabetes mellitus ([place])   . Anxiety   . Stable angina pectoris [facility])     Past Surgical History:  Procedure Laterality Date  . MOLE REMOVAL Right 07/01/2018    melanoma  surgery at [place] Medications    Prior to Admission medications   Medication Sig Start Date End Date Taking? Authorizing Provider  allopurinol (ZYLOPRIM) 300 MG tablet Take 300 mg by mouth daily.    [provider]  amLODipine (NORVASC) 10 MG tablet Take 10 mg by mouth daily.    [provider]  aspirin EC 81 MG tablet Take 81 mg by mouth daily.    [provider]  colchicine (COLCRYS)  0.6 MG tablet Take 1 tablet (0.6 mg total) by mouth daily as needed (for gout flares). 11/28/16   Reyne Dumas, MD  cyclobenzaprine (FLEXERIL) 10 MG tablet TAKE 1 TABLET BY MOUTH AS NEEDED THREE TIMES DAILY FOR 10 DAYS 01/17/18   [provider]  glimepiride (AMARYL) 2 MG tablet Take 2 mg by mouth daily with breakfast.    [provider]  indapamide (LOZOL) 2.5 MG tablet Take 1 tablet (2.5 mg total) by mouth daily. 11/26/16   Reyne Dumas, MD  LORazepam (ATIVAN) 0.5 MG tablet Take 0.5 mg by mouth daily as needed for anxiety.    [provider]  metFORMIN (GLUCOPHAGE) 1000 MG tablet Take 1,000 mg by mouth 2 (two) times daily with a meal.    [provider]  metoprolol succinate (TOPROL-XL) 50 MG 24 hr tablet Take 50 mg by mouth daily. Take with or immediately following a meal.    [provider]  omeprazole (PRILOSEC) 20 MG capsule Take 20 mg by mouth daily before breakfast.    [provider]  pioglitazone (ACTOS) 30 MG tablet Take 30 mg by mouth daily.    [provider]  sildenafil (REVATIO) 20 MG tablet Take 40-100 mg by mouth daily as needed (erectile dysfunction).  [provider]    Family History Family History  Problem Relation Age of Onset  . Diabetes Mellitus II Mother   . Colon cancer Father     Social History Social History   Tobacco Use  . Smoking status: Former Research scientist (life sciences)  . Smokeless tobacco: Never Used  . Tobacco comment: Quit 36 years ago  Substance Use Topics  . Alcohol use: Yes    Alcohol/week: 0.6 oz    Types: 1 Cans of beer per week  . Drug use: No     Allergies   Patient has no known allergies.   Review of Systems Review of Systems  Constitutional: Positive for appetite change. Negative for unexpected weight change.  HENT: Negative for congestion.   Respiratory: Negative for shortness of breath.   Cardiovascular: Negative for chest pain.  Gastrointestinal: Positive for abdominal  pain, nausea and vomiting. Negative for blood in stool and diarrhea.  Genitourinary: Negative for dysuria.  Musculoskeletal: Negative for back pain.  Skin: Negative for rash.  Neurological: Positive for weakness.  Psychiatric/Behavioral: Negative for confusion.     Physical Exam Updated Vital Signs BP 124/79 (BP Location: Right Arm)   Pulse 99   Temp 97.9 F (36.6 C) (Oral)   Resp 18   Ht 5\' 6"  (1.676 m)   Wt 61.2 kg (135 lb)   SpO2 93%   BMI 21.79 kg/m   Physical Exam  Constitutional: He appears well-developed.  HENT:  Dressing to right neck from recent surgery.  Eyes: Pupils are equal, round, and reactive to light. Scleral icterus is present.  Neck: Neck supple.  Cardiovascular:  Mild tachycardia  Pulmonary/Chest: Effort normal.  Abdominal: There is tenderness.  Right upper quadrant tenderness with hepatomegaly.  Musculoskeletal: He exhibits no tenderness.  Neurological: He is alert.  Skin: Skin is warm. Capillary refill takes less than 2 seconds.     ED Treatments / Results  Labs (all labs ordered are listed, but only abnormal results are displayed) Labs Reviewed  CBG MONITORING, ED - Abnormal; Notable for the following components:      Result Value   Glucose-Capillary 61 (*)    All other components within normal limits  LIPASE, BLOOD  URINALYSIS, ROUTINE W REFLEX MICROSCOPIC  CBG MONITORING, ED    EKG None  Radiology Ct Abdomen Pelvis W Contrast  Result Date: 01/31/2018 CLINICAL DATA:  History of melanoma, receiving Nivolumab, recent elevation in liver function tests. EXAM: CT ABDOMEN AND PELVIS WITH CONTRAST TECHNIQUE: Multidetector CT imaging of the abdomen and pelvis was performed using the standard protocol following bolus administration of intravenous contrast. CONTRAST:  116mL ISOVUE-300 IOPAMIDOL (ISOVUE-300) INJECTION 61% COMPARISON:  None. FINDINGS: Lower chest: Three-vessel coronary artery atherosclerosis. Fluid density crescentic structure  adjacent to the distal esophagus at the hiatus on image 19/2, measuring 1.7 by 1.6 cm, significance uncertain. Hepatobiliary: There is diffuse nodularity of the liver compatible with cirrhosis or pseudo cirrhosis. Borderline gallbladder wall thickening. Pancreas: Unremarkable Spleen: Extensive hypodense nodularity of variable size lesions throughout the spleen. An index lesion inferiorly in the spleen measures 2.8 by 2.4 cm on image 34/2. Adrenals/Urinary Tract: The adrenal glands appear normal. Two dominant fluid density cysts of the right kidney are noted. Stomach/Bowel: Descending and sigmoid colon diverticulosis without compelling findings of metastatic disease to bowel. Appendix normal. Vascular/Lymphatic: Aortoiliac atherosclerotic vascular disease. A porta hepatis lymph node just above the pancreas measures 1.2 cm in short axis on image 34/2, and could be reactive or malignant. Reproductive: The prostate gland measures  5.7 by 4.7 by 4.7 cm (volume = 66 cm^3). There is subcutaneous edema in the pubis extending into the scrotum and potentially the penis. Other: Trace fluid in the right paracolic gutter. Small amount of free pelvic fluid, image 83/2. Musculoskeletal: The confluent edema along the pubis also extends to overlie the right hip region laterally. 30% superior endplate compression fracture at T12, potentially chronic or late subacute. Lower lumbar degenerative facet arthropathy. IMPRESSION: 1. Diffuse low-density nodularity throughout the liver along with variable-sized diffuse low-density nodularity in the spleen. 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 Alternatively this could be true cirrhosis with either splenic metastatic disease or other causes of multiple splenic nodules to include lymphoid neoplasm, Littoral cell angioma, or less likely sarcoidosis. Workup pathways may include tissue diagnosis/biopsy or nuclear medicine PET-CT.  2. Mildly prominent porta hepatis lymph node could be reactive or neoplastic. 3. Abnormal confluent subcutaneous edema along the pubis extending into the scrotum and penis, cause uncertain. Subcutaneous edema also extends lateral to the right hip. 4. Small amount of free pelvic fluid, etiology uncertain. 5. Borderline gallbladder wall thickening. 6.  Aortic Atherosclerosis (ICD10-I70.0). 7. Mild prostatomegaly. 8. 30% superior endplate compression fracture of T12, probably chronic or late subacute. Electronically Signed   By: Van Clines M.D.   On: 01/31/2018 16:16    Procedures Procedures (including critical care time)  Medications Ordered in ED Medications - No data to display   Initial Impression / Assessment and Plan / ED Course  I have reviewed the triage vital signs and the nursing notes.  Pertinent labs & imaging results that were available during my care of the patient were reviewed by me and considered in my medical decision making (see chart for details).     Patient with nausea and vomiting.  Hypoglycemia that is worsened while in the ER, however the initial CBG of 60 had not been addressed before he got back to a room and by then it had gone down to 50.  Has not really been tolerating orals but is awake and appropriate.  Will attempt some orals with antiemetics and will give an amp of D50.  May require D5 drip if continues to have decreased oral intake.  However with the vomiting hypoglycemia and CT scan findings will admit to hospitalist  Final Clinical Impressions(s) / ED Diagnoses   Final diagnoses:  Nausea and vomiting, intractability of vomiting not specified, unspecified vomiting type  Metastatic cancer [facility])    ED Discharge Orders    None       Davonna Belling, MD 01/31/18 1805

## 2018-01-31 NOTE — Progress Notes (Deleted)
Bryan Lloyd OFFICE PROGRESS NOTE  Bryan Dus, MD Bryan Lloyd [id number]  DIAGNOSIS: Melanoma  PRIOR THERAPY: He underwent wide excision and sentinel lymph node sampling to the lesion on the right side of his face completed in November 2018.  He developed relapsed disease in March 2019 with palpable cervical lymph nodes.  This was biopsy-proven to showed metastatic melanoma.  And subsequently underwent lymph node dissection of the right cervical chain with 5 out of 7 lymph nodes showed disease involvement.  CURRENT THERAPY: Nivolumab 480 mg every 4 weeks.  Started in April 2019.  Status post 6 cycles.  INTERVAL HISTORY: Bryan Lloyd 77 y.o. male returns for *** regular *** visit for followup of ***   MEDICAL HISTORY: Past Medical History:  Diagnosis Date  . Anxiety   . Diabetes mellitus ([place])   . Gout   . HTN (hypertension)     ALLERGIES:  has No Known Allergies.  MEDICATIONS:  Current Outpatient Medications  Medication Sig Dispense Refill  . allopurinol (ZYLOPRIM) 300 MG tablet Take 300 mg by mouth daily.    Marland Kitchen amLODipine (NORVASC) 10 MG tablet Take 10 mg by mouth daily.    Marland Kitchen aspirin EC 81 MG tablet Take 81 mg by mouth daily.    . colchicine (COLCRYS) 0.6 MG tablet Take 1 tablet (0.6 mg total) by mouth daily as needed (for gout flares). 30 tablet 1  . cyclobenzaprine (FLEXERIL) 10 MG tablet TAKE 1 TABLET BY MOUTH AS NEEDED THREE TIMES DAILY FOR 10 DAYS  0  . glimepiride (AMARYL) 2 MG tablet Take 2 mg by mouth daily with breakfast.    . indapamide (LOZOL) 2.5 MG tablet Take 1 tablet (2.5 mg total) by mouth daily. 30 tablet 1  . LORazepam (ATIVAN) 0.5 MG tablet Take 0.5 mg by mouth daily as needed for anxiety.    . metFORMIN (GLUCOPHAGE) 1000 MG tablet Take 1,000 mg by mouth 2 (two) times daily with a meal.    . metoprolol succinate (TOPROL-XL) 50 MG 24 hr tablet Take 50 mg by mouth daily. Take with or immediately following a meal.    .  omeprazole (PRILOSEC) 20 MG capsule Take 20 mg by mouth daily before breakfast.    . pioglitazone (ACTOS) 30 MG tablet Take 30 mg by mouth daily.    . sildenafil (REVATIO) 20 MG tablet Take 40-100 mg by mouth daily as needed (erectile dysfunction).      No current facility-administered medications for this visit.     SURGICAL HISTORY:  Past Surgical History:  Procedure Laterality Date  . MOLE REMOVAL Right 07/01/2018    melanoma  surgery at [facility]  . NO PAST SURGERIES      REVIEW OF SYSTEMS:   Review of Systems  Constitutional: Negative for appetite change, chills, fatigue, fever and unexpected weight change.  HENT:   Negative for mouth sores, nosebleeds, sore throat and trouble swallowing.   Eyes: Negative for eye problems and icterus.  Respiratory: Negative for cough, hemoptysis, shortness of breath and wheezing.   Cardiovascular: Negative for chest pain and leg swelling.  Gastrointestinal: Negative for abdominal pain, constipation, diarrhea, nausea and vomiting.  Genitourinary: Negative for bladder incontinence, difficulty urinating, dysuria, frequency and hematuria.   Musculoskeletal: Negative for back pain, gait problem, neck pain and neck stiffness.  Skin: Negative for itching and rash.  Neurological: Negative for dizziness, extremity weakness, gait problem, headaches, light-headedness and seizures.  Hematological: Negative for adenopathy. Does  not bruise/bleed easily.  Psychiatric/Behavioral: Negative for confusion, depression and sleep disturbance. The patient is not nervous/anxious.     PHYSICAL EXAMINATION:  There were no vitals taken for this visit.  ECOG PERFORMANCE STATUS: 1 - Symptomatic but completely ambulatory  Physical Exam  Constitutional: Oriented to person, place, and time and well-developed, well-nourished, and in no distress. No distress.  HENT:  Head: Normocephalic and atraumatic.  Mouth/Throat: Oropharynx is clear and moist. No oropharyngeal  exudate.  Eyes: Conjunctivae are normal. Right eye exhibits no discharge. Left eye exhibits no discharge. No scleral icterus.  Neck: Normal range of motion. Neck supple.  Cardiovascular: Normal rate, regular rhythm, normal heart sounds and intact distal pulses.   Pulmonary/Chest: Effort normal and breath sounds normal. No respiratory distress. No wheezes. No rales.  Abdominal: Soft. Bowel sounds are normal. Exhibits no distension and no mass. There is no tenderness.  Musculoskeletal: Normal range of motion. Exhibits no edema.  Lymphadenopathy:    No cervical adenopathy.  Neurological: Alert and oriented to person, place, and time. Exhibits normal muscle tone. Gait normal. Coordination normal.  Skin: Skin is warm and dry. No rash noted. Not diaphoretic. No erythema. No pallor.  Psychiatric: Mood, memory and judgment normal.  Vitals reviewed.  LABORATORY DATA: Lab Results  Component Value Date   WBC 13.1 (H) 01/31/2018   HGB 14.9 01/31/2018   HCT 44.7 01/31/2018   MCV 85.0 01/31/2018   PLT 183 01/31/2018      Chemistry      Component Value Date/Time   NA 140 11/23/2016 0525   K 3.6 11/23/2016 0525   CL 105 11/23/2016 0525   CO2 25 11/23/2016 0525   BUN 21 (H) 11/23/2016 0525   CREATININE 1.46 (H) 11/23/2016 0525      Component Value Date/Time   CALCIUM 9.5 11/23/2016 0525   ALKPHOS 63 11/22/2016 2152   AST 26 11/22/2016 2152   ALT 28 11/22/2016 2152   BILITOT 0.8 11/22/2016 2152       RADIOGRAPHIC STUDIES:  No results found.   ASSESSMENT/PLAN:  No problem-specific Assessment & Plan notes found for this encounter.  No orders of the defined types were placed in this encounter.  Mikey Bussing, DNP, AGPCNP-BC, AOCNP 01/31/18

## 2018-01-31 NOTE — Telephone Encounter (Signed)
Received call from [facility] in the lab regarding critical results. AST 357, Total Bili 4.3. Lab results were reported to Mikey Bussing NP who is familiar with the patient. Has a scheduled CT today and appointment scheduled with Erasmo Downer on 7/1.

## 2018-02-01 DIAGNOSIS — K7589 Other specified inflammatory liver diseases: Secondary | ICD-10-CM

## 2018-02-01 DIAGNOSIS — T451X5A Adverse effect of antineoplastic and immunosuppressive drugs, initial encounter: Secondary | ICD-10-CM

## 2018-02-01 DIAGNOSIS — R627 Adult failure to thrive: Secondary | ICD-10-CM

## 2018-02-01 DIAGNOSIS — E119 Type 2 diabetes mellitus without complications: Secondary | ICD-10-CM

## 2018-02-01 DIAGNOSIS — C4330 Malignant melanoma of unspecified part of face: Secondary | ICD-10-CM

## 2018-02-01 DIAGNOSIS — I10 Essential (primary) hypertension: Secondary | ICD-10-CM

## 2018-02-01 LAB — PROTIME-INR
INR: 1.33
Prothrombin Time: 16.3 seconds — ABNORMAL HIGH (ref 11.4–15.2)

## 2018-02-01 LAB — CBC
HCT: 38.3 % — ABNORMAL LOW (ref 39.0–52.0)
Hemoglobin: 13 g/dL (ref 13.0–17.0)
MCH: 28.3 pg (ref 26.0–34.0)
MCHC: 33.9 g/dL (ref 30.0–36.0)
MCV: 83.4 fL (ref 78.0–100.0)
PLATELETS: 161 10*3/uL (ref 150–400)
RBC: 4.59 MIL/uL (ref 4.22–5.81)
RDW: 17.1 % — AB (ref 11.5–15.5)
WBC: 10.9 10*3/uL — AB (ref 4.0–10.5)

## 2018-02-01 LAB — COMPREHENSIVE METABOLIC PANEL
ALT: 144 U/L — ABNORMAL HIGH (ref 0–44)
ANION GAP: 17 — AB (ref 5–15)
AST: 271 U/L — AB (ref 15–41)
Albumin: 2.5 g/dL — ABNORMAL LOW (ref 3.5–5.0)
Alkaline Phosphatase: 397 U/L — ABNORMAL HIGH (ref 38–126)
BILIRUBIN TOTAL: 3.5 mg/dL — AB (ref 0.3–1.2)
BUN: 26 mg/dL — AB (ref 8–23)
CO2: 21 mmol/L — ABNORMAL LOW (ref 22–32)
Calcium: 8.7 mg/dL — ABNORMAL LOW (ref 8.9–10.3)
Chloride: 95 mmol/L — ABNORMAL LOW (ref 98–111)
Creatinine, Ser: 1.12 mg/dL (ref 0.61–1.24)
Glucose, Bld: 68 mg/dL — ABNORMAL LOW (ref 70–99)
Potassium: 4.3 mmol/L (ref 3.5–5.1)
Sodium: 133 mmol/L — ABNORMAL LOW (ref 135–145)
TOTAL PROTEIN: 5 g/dL — AB (ref 6.5–8.1)

## 2018-02-01 LAB — GLUCOSE, CAPILLARY: Glucose-Capillary: 66 mg/dL — ABNORMAL LOW (ref 70–99)

## 2018-02-01 LAB — HEMOGLOBIN A1C
Hgb A1c MFr Bld: 5.9 % — ABNORMAL HIGH (ref 4.8–5.6)
Mean Plasma Glucose: 122.63 mg/dL

## 2018-02-01 LAB — MAGNESIUM: Magnesium: 1.6 mg/dL — ABNORMAL LOW (ref 1.7–2.4)

## 2018-02-01 LAB — PREALBUMIN: Prealbumin: 10.5 mg/dL — ABNORMAL LOW (ref 18–38)

## 2018-02-01 LAB — TSH: TSH: 1.862 u[IU]/mL (ref 0.350–4.500)

## 2018-02-01 LAB — PHOSPHORUS: PHOSPHORUS: 2.4 mg/dL — AB (ref 2.5–4.6)

## 2018-02-01 MED ORDER — PREDNISONE 50 MG PO TABS
60.0000 mg | ORAL_TABLET | Freq: Every day | ORAL | Status: DC
  Administered 2018-02-01 – 2018-02-02 (×2): 60 mg via ORAL
  Filled 2018-02-01 (×2): qty 1

## 2018-02-01 MED ORDER — DEXTROSE 50 % IV SOLN
25.0000 mL | Freq: Once | INTRAVENOUS | Status: AC
  Administered 2018-02-01: 25 mL via INTRAVENOUS
  Filled 2018-02-01: qty 50

## 2018-02-01 MED ORDER — MAGNESIUM SULFATE 2 GM/50ML IV SOLN
2.0000 g | Freq: Once | INTRAVENOUS | Status: AC
  Administered 2018-02-01: 2 g via INTRAVENOUS
  Filled 2018-02-01: qty 50

## 2018-02-01 NOTE — Progress Notes (Signed)
HEMATOLOGY-ONCOLOGY PROGRESS NOTE  SUBJECTIVE: Patient has been hospitalized with failure to thrive and worsening of liver function tests. 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  He underwent excision at [facility] July 01, 2017.  Final pathology showed no residual melanoma and 2 sentinel lymph node biopsies were negative.  He developed a right cervical lymph node and underwent fine-needle aspiration in March 2019 that showed malignant cells and he was started on neoadjuvant nivolumab beginning April.  He subsequently transition to see Dr. Alen Blew.  That should have been closely watching him he has developed progressive fatigue and tiredness and laboratory tests performed showed elevation of liver function tests.  Yesterday evening he underwent a CT of his abdomen and he was subsequently admitted to the hospital because of continuing worsening of his fatigue.  He went to the emergency room and from there he was admitted.  OBJECTIVE: REVIEW OF SYSTEMS:   Constitutional: Fatigue and weakness Eyes: Denies blurriness of vision Ears, nose, mouth, throat, and face: Evidence of prior surgery Respiratory: Denies cough, dyspnea or wheezes Cardiovascular: Denies palpitation, chest discomfort Gastrointestinal:  Denies nausea, heartburn or change in bowel habits Skin: Denies abnormal skin rashes Lymphatics: Denies new lymphadenopathy or easy bruising Neurological: Generalized weakness Behavioral/Psych: Mood is stable, no new changes  Extremities: No lower extremity edema  All other systems were reviewed with the patient and are negative.  I have reviewed the past medical history, past surgical history, social history and family history with the patient and they are unchanged from previous note.   PHYSICAL EXAMINATION: ECOG PERFORMANCE STATUS: 2 - Symptomatic, <50% confined to bed  Vitals:   01/31/18 2118  02/01/18 0601  BP: (!) 143/74 129/79  Pulse: 95 82  Resp: 20 20  Temp: 99.1 F (37.3 C) 98.2 F (36.8 C)  SpO2: 97% 98%   Filed Weights   01/31/18 1608  Weight: 135 lb (61.2 kg)    GENERAL:alert, no distress and comfortable SKIN: skin color, texture, turgor are normal, no rashes or significant lesions EYES: normal, Conjunctiva are pink and non-injected, sclera clear OROPHARYNX:no exudate, no erythema and lips, buccal mucosa, and tongue normal  NECK: supple, thyroid normal size, non-tender, without nodularity LYMPH:  no palpable lymphadenopathy in the cervical, axillary or inguinal LUNGS: clear to auscultation and percussion with normal breathing effort HEART: regular rate & rhythm and no murmurs and no lower extremity edema ABDOMEN:abdomen soft, non-tender and normal bowel sounds Musculoskeletal:no cyanosis of digits and no clubbing  NEURO: alert & oriented x 3 with fluent speech, no focal motor/sensory deficits  LABORATORY DATA:  I have reviewed the data as listed CMP Latest Ref Rng & Units 02/01/2018 01/31/2018 11/23/2016  Glucose 70 - 99 mg/dL 68(L) 85 94  BUN 8 - 23 mg/dL 26(H) 30(H) 21(H)  Creatinine 0.61 - 1.24 mg/dL 1.12 1.31(H) 1.46(H)  Sodium 135 - 145 mmol/L 133(L) 136 140  Potassium 3.5 - 5.1 mmol/L 4.3 4.3 3.6  Chloride 98 - 111 mmol/L 95(L) 93(L) 105  CO2 22 - 32 mmol/L 21(L) 24 25  Calcium 8.9 - 10.3 mg/dL 8.7(L) 9.6 9.5  Total Protein 6.5 - 8.1 g/dL 5.0(L) 6.0(L) -  Total Bilirubin 0.3 - 1.2 mg/dL 3.5(H) 4.3(H) -  Alkaline Phos 38 - 126 U/L 397(H) 509(H) -  AST 15 - 41 U/L 271(H) 357(H) -  ALT 0 - 44 U/L 144(H) 170(H) -    Lab Results  Component Value  Date   WBC 10.9 (H) 02/01/2018   HGB 13.0 02/01/2018   HCT 38.3 (L) 02/01/2018   MCV 83.4 02/01/2018   PLT 161 02/01/2018   NEUTROABS 10.5 (H) 01/31/2018  CT abdomen and pelvis 01/23/2018: IMPRESSION: 1. Diffuse low-density nodularity throughout the liver along with variable-sized diffuse low-density  nodularity in the spleen. 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 Alternatively this could be true cirrhosis with either splenic metastatic disease or other causes of multiple splenic nodules to include lymphoid neoplasm, Littoral cell angioma, or less likely sarcoidosis. Workup pathways may include tissue diagnosis/biopsy or nuclear medicine PET-CT. 2. Mildly prominent porta hepatis lymph node could be reactive or neoplastic. 3. Abnormal confluent subcutaneous edema along the pubis extending into the scrotum and penis, cause uncertain. Subcutaneous edema also extends lateral to the right hip. 4. Small amount of free pelvic fluid, etiology uncertain. 5. Borderline gallbladder wall thickening. 6.  Aortic Atherosclerosis (ICD10-I70.0). 7. Mild prostatomegaly. 8. 30% superior endplate compression fracture of T12, probably chronic or late subacute.  ASSESSMENT AND PLAN: 1.  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 I reviewed the CT scan which showed pseudocirrhosis and changes that cannot rule out metastatic disease in the liver and spleen. I informed the patient that the only way to know the difference would be to biopsy the liver. Since yesterday his LFTs have come down somewhat.  His bilirubin came from 4.3-3.5.  Alkaline phosphatase decreased from 590 to 397.  He is AST from 357-271 and ALT from 170-144.  If it was metastatic disease it would  not have decreased so quickly.  after much deliberation I recommended that we start him on prednisone 1 mg/kg If his LFTs do not get better over the next 3-4 days then we may have to give him either mycophenolate or methylprednisolone (IV 4mg /kg/day). The other option would be to consider performing a liver biopsy. Would also like to obtain hepatitis viral panel along with autoimmune hepatitis work-up.

## 2018-02-01 NOTE — Progress Notes (Signed)
PROGRESS NOTE    Bryan Lloyd  WIO:[id number] DOB: 1941/03/30 DOA: 01/31/2018 PCP: [name], MD   Brief Narrative:  77 year old male with history of diabetes mellitus type 2, essential hypertension, facial melanoma diagnosed in 2018 status post excision with concerning recurrence in March 2019 came to the hospital with complains of fatigue.  Due to the hospital on 6/28 due to failure to thrive and worsening of his liver function test.  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  It was thought that his transaminitis was likely secondary to drug-induced hepatitis, oncology was consulted.   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  Suspect he may have drug-induced hepatitis - Oncology has been consulted who recommended starting patient on prednisone 1 mg/kg.  Autoimmune hepatitis panel and viral panel has been ordered.  If no improvement in LFTs, may need liver biopsy -Pain control, antiemetics, supportive care -Nutrition consult  Mild dehydration - Gentle hydration.  Provide supportive care  Diabetes mellitus type 2 -Insulin sliding scale.  Holding glimepiride, metformin and Actos.  Subacute compression fracture-T12 -Pain control, physical therapy but patient is not much interested at this time  Essential hypertension -Amlodipine 10 mg daily is on hold.  Metoprolol 50 mg daily  GERD -PPI daily  DVT prophylaxis: Lovenox Code Status: Full code Family Communication: Daughter at bedside Disposition Plan:  Maintain inpatient stay  Consultants:   Oncology  Procedures:   None  Antimicrobials:   None   Subjective: No complaints.  Has not tried eating yet.  Review of Systems Otherwise negative except as per HPI, including: General: Denies fever, chills, night sweats or unintended weight loss. Resp: Denies cough, wheezing, shortness of breath. Cardiac: Denies chest pain, palpitations, orthopnea, paroxysmal nocturnal dyspnea. GI: Denies abdominal pain, nausea, vomiting, diarrhea or constipation GU: Denies dysuria, frequency, hesitancy or incontinence MS: Denies muscle aches, joint pain or swelling Neuro: Denies headache, neurologic deficits (focal weakness, numbness, tingling), abnormal gait Psych: Denies anxiety, depression, SI/HI/AVH Skin: Denies new rashes or lesions ID: Denies sick contacts, exotic exposures, travel  Objective: Vitals:   01/31/18 1953 01/31/18 2045 01/31/18 2118 02/01/18 0601  BP: (!) 144/82 (!) 141/89 (!) 143/74 129/79  Pulse: (!) 101 (!) 102 95 82  Resp: 16 (!) 23 20 20   Temp:   99.1 F (37.3 C) 98.2 F (36.8 C)  TempSrc:    Oral  SpO2: 97% 98% 97% 98%  Weight:      Height:        Intake/Output Summary (Last 24 hours) at 02/01/2018 1146 Last data filed at 02/01/2018 1010 Gross per 24 hour  Intake 1240 ml  Output -  Net 1240 ml   Filed Weights   01/31/18 1608  Weight: 61.2 kg (135 lb)    Examination:  General exam: Appears calm and comfortable, cachectic, elderly frail-appearing Respiratory system: Clear to auscultation. Respiratory effort normal. Cardiovascular system: S1 & S2 heard, RRR. No JVD, murmurs, rubs, gallops or clicks. No pedal edema. Gastrointestinal system: Abdomen is nondistended, soft and nontender. No organomegaly or masses felt. Normal bowel sounds  heard. Central nervous system: Alert and oriented. No focal neurological deficits. Extremities: Symmetric 5 x 5 power. Skin: No rashes, lesions or ulcers Psychiatry: Judgement  and insight appear normal. Mood & affect appropriate.     Data Reviewed:   CBC: Recent Labs  Lab 01/31/18 1318 02/01/18 0334  WBC 13.1* 10.9*  NEUTROABS 10.5*  --   HGB 14.9 13.0  HCT 44.7 38.3*  MCV 85.0 83.4  PLT 183 751   Basic Metabolic Panel: Recent Labs  Lab 01/31/18 1310 02/01/18 0334  NA 136 133*  K 4.3 4.3  CL 93* 95*  CO2 24 21*  GLUCOSE 85 68*  BUN 30* 26*  CREATININE 1.31* 1.12  CALCIUM 9.6 8.7*  MG  --  1.6*  PHOS  --  2.4*   GFR: Estimated Creatinine Clearance: 49.3 mL/min (by C-G formula based on SCr of 1.12 mg/dL). Liver Function Tests: Recent Labs  Lab 01/31/18 1310 02/01/18 0334  AST 357* 271*  ALT 170* 144*  ALKPHOS 509* 397*  BILITOT 4.3* 3.5*  PROT 6.0* 5.0*  ALBUMIN 2.9* 2.5*   Recent Labs  Lab 01/31/18 1759  LIPASE 28   No results for input(s): AMMONIA in the last 168 hours. Coagulation Profile: Recent Labs  Lab 02/01/18 0334  INR 1.33   Cardiac Enzymes: No results for input(s): CKTOTAL, CKMB, CKMBINDEX, TROPONINI in the last 168 hours. BNP (last 3 results) No results for input(s): PROBNP in the last 8760 hours. HbA1C: Recent Labs    02/01/18 0334  HGBA1C 5.9*   CBG: Recent Labs  Lab 01/31/18 1604 01/31/18 1759 01/31/18 1850 01/31/18 2002 02/01/18 0003  GLUCAP 61* 54* 159* 131* 66*   Lipid Profile: No results for input(s): CHOL, HDL, LDLCALC, TRIG, CHOLHDL, LDLDIRECT in the last 72 hours. Thyroid Function Tests: Recent Labs    02/01/18 0334  TSH 1.862   Anemia Panel: No results for input(s): VITAMINB12, FOLATE, FERRITIN, TIBC, IRON, RETICCTPCT in the last 72 hours. Sepsis Labs: No results for input(s): PROCALCITON, LATICACIDVEN in the last 168 hours.  No results found for this or any previous visit (from the past 240 hour(s)).       Radiology Studies: Ct Abdomen Pelvis W Contrast  Result Date: 01/31/2018 CLINICAL DATA:  History of melanoma, receiving Nivolumab, recent elevation in liver  function tests. EXAM: CT ABDOMEN AND PELVIS WITH CONTRAST TECHNIQUE: Multidetector CT imaging of the abdomen and pelvis was performed using the standard protocol following bolus administration of intravenous contrast. CONTRAST:  189mL ISOVUE-300 IOPAMIDOL (ISOVUE-300) INJECTION 61% COMPARISON:  None. FINDINGS: Lower chest: Three-vessel coronary artery atherosclerosis. Fluid density crescentic structure adjacent to the distal esophagus at the hiatus on image 19/2, measuring 1.7 by 1.6 cm, significance uncertain. Hepatobiliary: There is diffuse nodularity of the liver compatible with cirrhosis or pseudo cirrhosis. Borderline gallbladder wall thickening. Pancreas: Unremarkable Spleen: Extensive hypodense nodularity of variable size lesions throughout the spleen. An index lesion inferiorly in the spleen measures 2.8 by 2.4 cm on image 34/2. Adrenals/Urinary Tract: The adrenal glands appear normal. Two dominant fluid density cysts of the right kidney are noted. Stomach/Bowel: Descending and sigmoid colon diverticulosis without compelling findings of metastatic disease to bowel. Appendix normal. Vascular/Lymphatic: Aortoiliac atherosclerotic vascular disease. A porta hepatis lymph node just above the pancreas measures 1.2 cm in short axis on image 34/2, and could be reactive or malignant. Reproductive: The prostate gland measures 5.7 by 4.7 by 4.7 cm (volume = 66 cm^3). There is subcutaneous edema in the pubis extending into the scrotum  and potentially the penis. Other: Trace fluid in the right paracolic gutter. Small amount of free pelvic fluid, image 83/2. Musculoskeletal: The confluent edema along the pubis also extends to overlie the right hip region laterally. 30% superior endplate compression fracture at T12, potentially chronic or late subacute. Lower lumbar degenerative facet arthropathy. IMPRESSION: 1. Diffuse low-density nodularity throughout the liver along with variable-sized diffuse low-density nodularity in  the spleen. 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 Alternatively this could be true cirrhosis with either splenic metastatic disease or other causes of multiple splenic nodules to include lymphoid neoplasm, Littoral cell angioma, or less likely sarcoidosis. Workup pathways may include tissue diagnosis/biopsy or nuclear medicine PET-CT. 2. Mildly prominent porta hepatis lymph node could be reactive or neoplastic. 3. Abnormal confluent subcutaneous edema along the pubis extending into the scrotum and penis, cause uncertain. Subcutaneous edema also extends lateral to the right hip. 4. Small amount of free pelvic fluid, etiology uncertain. 5. Borderline gallbladder wall thickening. 6.  Aortic Atherosclerosis (ICD10-I70.0). 7. Mild prostatomegaly. 8. 30% superior endplate compression fracture of T12, probably chronic or late subacute. Electronically Signed   By: Van Clines M.D.   On: 01/31/2018 16:16        Scheduled Meds: . allopurinol  300 mg Oral Daily  . chlorhexidine  15 mL Mouth Rinse BID  . enoxaparin (LOVENOX) injection  40 mg Subcutaneous Q24H  . feeding supplement (ENSURE ENLIVE)  237 mL Oral BID BM  . insulin aspart  0-9 Units Subcutaneous Q4H  . mouth rinse  15 mL Mouth Rinse q12n4p  . metoprolol tartrate  25 mg Oral BID  . pantoprazole  40 mg Oral Daily  . predniSONE  60 mg Oral Q breakfast  . senna  1 tablet Oral BID   Continuous Infusions: . dextrose 5 % and 0.45% NaCl 75 mL/hr at 02/01/18 0820     LOS: 1 day    I have spent 35 minutes face to face with the patient and on the ward discussing the patients care, assessment, plan and disposition with other care givers. >50% of the time was devoted counseling the patient about the risks and benefits of treatment and coordinating care.     Ankit Arsenio Loader, MD [facility] Pager [phone]   If 7PM-7AM, please contact  night-coverage [email or web address] Password [facility] 02/01/2018, 11:46 AM

## 2018-02-02 DIAGNOSIS — C439 Malignant melanoma of skin, unspecified: Secondary | ICD-10-CM

## 2018-02-02 DIAGNOSIS — R740 Nonspecific elevation of levels of transaminase and lactic acid dehydrogenase [LDH]: Secondary | ICD-10-CM

## 2018-02-02 LAB — CBC
HCT: 40.7 % (ref 39.0–52.0)
Hemoglobin: 13.9 g/dL (ref 13.0–17.0)
MCH: 28.8 pg (ref 26.0–34.0)
MCHC: 34.2 g/dL (ref 30.0–36.0)
MCV: 84.3 fL (ref 78.0–100.0)
Platelets: 180 10*3/uL (ref 150–400)
RBC: 4.83 MIL/uL (ref 4.22–5.81)
RDW: 17.1 % — AB (ref 11.5–15.5)
WBC: 13 10*3/uL — ABNORMAL HIGH (ref 4.0–10.5)

## 2018-02-02 LAB — COMPREHENSIVE METABOLIC PANEL
ALBUMIN: 2.5 g/dL — AB (ref 3.5–5.0)
ALT: 148 U/L — ABNORMAL HIGH (ref 0–44)
ANION GAP: 16 — AB (ref 5–15)
AST: 274 U/L — AB (ref 15–41)
Alkaline Phosphatase: 535 U/L — ABNORMAL HIGH (ref 38–126)
BILIRUBIN TOTAL: 3.9 mg/dL — AB (ref 0.3–1.2)
BUN: 23 mg/dL (ref 8–23)
CO2: 21 mmol/L — ABNORMAL LOW (ref 22–32)
Calcium: 9.1 mg/dL (ref 8.9–10.3)
Chloride: 95 mmol/L — ABNORMAL LOW (ref 98–111)
Creatinine, Ser: 0.96 mg/dL (ref 0.61–1.24)
GFR calc Af Amer: >60 mL/min (ref >60)
GLUCOSE: 121 mg/dL — AB (ref 70–99)
POTASSIUM: 4.6 mmol/L (ref 3.5–5.1)
Sodium: 132 mmol/L — ABNORMAL LOW (ref 135–145)
TOTAL PROTEIN: 5.3 g/dL — AB (ref 6.5–8.1)

## 2018-02-02 LAB — ANTI-SMOOTH MUSCLE ANTIBODY, IGG: F-ACTIN AB IGG: 11 U (ref 0–19)

## 2018-02-02 LAB — MAGNESIUM: MAGNESIUM: 1.8 mg/dL (ref 1.7–2.4)

## 2018-02-02 LAB — HEPATITIS PANEL, ACUTE
HCV Ab: <0.1 s/co ratio (ref 0.0–0.9)
HEP A IGM: NEGATIVE
HEP B C IGM: NEGATIVE
Hepatitis B Surface Ag: NEGATIVE

## 2018-02-02 NOTE — Progress Notes (Signed)
PROGRESS NOTE    Bryan Lloyd  ZOX:[id number] DOB: Jan 17, 1941 DOA: 01/31/2018 PCP: [name], MD   Brief Narrative:  77 year old male with history of diabetes mellitus type 2, essential hypertension, facial melanoma diagnosed in 2018 status post excision with concerning recurrence in March 2019 came to the hospital with complains of fatigue.  Due to the hospital on 6/28 due to failure to thrive and worsening of his liver function test.  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  It was thought that his transaminitis was likely secondary to drug-induced hepatitis, oncology was consulted.   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  - CT of the abdomen pelvis shows progression of his chronic disease otherwise nothing acute at this time.  Suspect he may have drug-induced hepatitis - Oncology has been consulted who recommended starting patient on prednisone 1 mg/kg.  Am labs for lfts pending.  -Autoimmune hepatitis panel and viral panel has been ordered- Pending.  If no improvement in LFTs, may need liver biopsy in the coming week.  -Pain control, antiemetics, supportive care -Nutrition consult  Mild dehydration - Gentle hydration.  Provide supportive care  Diabetes mellitus type 2 -Insulin sliding scale.  Holding glimepiride, metformin and Actos. Monitor closesly for hypoglycemia. Currently on D5 1/2 NS 75cc/hr  Subacute compression fracture-T12 -Pain control, physical therapy but patient is not much interested at this time  Essential hypertension -Amlodipine 10 mg daily is on hold.  Metoprolol 50 mg  daily  GERD -PPI daily  DVT prophylaxis: Lovenox Code Status: Full code Family Communication: None at bedside  Disposition Plan: Maintain inpatient stay.   Consultants:   Oncology  Procedures:   None  Antimicrobials:   None   Subjective: Eating breakfast without any complaints. Tolerated dinner last night.   Review of Systems Otherwise negative except as per HPI, including: General = no fevers, chills, dizziness, malaise, fatigue HEENT/EYES = negative for pain, redness, loss of vision, double vision, blurred vision, loss of hearing, sore throat, hoarseness, dysphagia Cardiovascular= negative for chest pain, palpitation, murmurs, lower extremity swelling Respiratory/lungs= negative for shortness of breath, cough, hemoptysis, wheezing, mucus production Gastrointestinal= negative for nausea, vomiting,, abdominal pain, melena, hematemesis Genitourinary= negative for Dysuria, Hematuria, Change in Urinary Frequency MSK = Negative for arthralgia, myalgias, Back Pain, Joint swelling  Neurology= Negative for headache, seizures, numbness, tingling  Psychiatry= Negative for anxiety, depression, suicidal and homocidal ideation Allergy/Immunology= Medication/Food allergy as listed  Skin= Negative for Rash, lesions, ulcers, itching  Objective: Vitals:   02/01/18 1300 02/01/18 1836 02/01/18 2020 02/02/18 0509  BP: 139/83 131/79 140/86 (!) 143/83  Pulse: 96 (!) 104 100 88  Resp: 20 20 18 16   Temp: (!) 97.4 F (36.3 C) 97.8 F (36.6 C) 97.7 F (36.5 C) 97.8 F (36.6 C)  TempSrc: Oral Oral Oral Oral  SpO2: 98% 99% 99% 98%  Weight:      Height:        Intake/Output Summary (Last 24 hours) at 02/02/2018 1015 Last data filed at 02/01/2018 1800 Gross per 24 hour  Intake 1880 ml  Output -  Net 1880 ml   Filed Weights   01/31/18 1608  Weight: 61.2 kg (135 lb)    Examination: Constitutional: NAD, calm, comfortable; cachectic frail with bilateral temporal wasting Eyes:  PERRL, lids and conjunctivae normal; + scleral icterus ENMT: Mucous membranes are moist. Posterior pharynx clear of any exudate or lesions.Normal dentition.  Neck: normal, supple, no masses, no thyromegaly Respiratory: clear to auscultation bilaterally, no wheezing, no crackles. Normal respiratory effort. No accessory muscle use.  Cardiovascular: Regular rate and rhythm, no murmurs / rubs / gallops. No extremity edema. 2+ pedal pulses. No carotid bruits.  Abdomen: no tenderness, no masses palpated. No hepatosplenomegaly. Bowel sounds positive.  Musculoskeletal: no clubbing / cyanosis. No joint deformity upper and lower extremities. Good ROM, no contractures. Normal muscle tone.  Skin: no rashes, lesions, ulcers. No induration Neurologic: CN 2-12 grossly intact. Sensation intact, DTR normal. Strength 5/5 in all 4.  Psychiatric: Normal judgment and insight. Alert and oriented x 3. Normal mood.   Data Reviewed:   CBC: Recent Labs  Lab 01/31/18 1318 02/01/18 0334 02/02/18 0759  WBC 13.1* 10.9* 13.0*  NEUTROABS 10.5*  --   --   HGB 14.9 13.0 13.9  HCT 44.7 38.3* 40.7  MCV 85.0 83.4 84.3  PLT 183 161 580   Basic Metabolic Panel: Recent Labs  Lab 01/31/18 1310 02/01/18 0334 02/02/18 0759  NA 136 133* 132*  K 4.3 4.3 4.6  CL 93* 95* 95*  CO2 24 21* 21*  GLUCOSE 85 68* 121*  BUN 30* 26* 23  CREATININE 1.31* 1.12 0.96  CALCIUM 9.6 8.7* 9.1  MG  --  1.6* 1.8  PHOS  --  2.4*  --    GFR: Estimated Creatinine Clearance: 57.6 mL/min (by C-G formula based on SCr of 0.96 mg/dL). Liver Function Tests: Recent Labs  Lab 01/31/18 1310 02/01/18 0334 02/02/18 0759  AST 357* 271* PENDING  ALT 170* 144* 148*  ALKPHOS 509* 397* 535*  BILITOT 4.3* 3.5* 3.9*  PROT 6.0* 5.0* 5.3*  ALBUMIN 2.9* 2.5* 2.5*   Recent Labs  Lab 01/31/18 1759  LIPASE 28   No results for input(s): AMMONIA in the last 168 hours. Coagulation Profile: Recent Labs  Lab 02/01/18 0334  INR 1.33   Cardiac  Enzymes: No results for input(s): CKTOTAL, CKMB, CKMBINDEX, TROPONINI in the last 168 hours. BNP (last 3 results) No results for input(s): PROBNP in the last 8760 hours. HbA1C: Recent Labs    02/01/18 0334  HGBA1C 5.9*   CBG: Recent Labs  Lab 01/31/18 1604 01/31/18 1759 01/31/18 1850 01/31/18 2002 02/01/18 0003  GLUCAP 61* 54* 159* 131* 66*   Lipid Profile: No results for input(s): CHOL, HDL, LDLCALC, TRIG, CHOLHDL, LDLDIRECT in the last 72 hours. Thyroid Function Tests: Recent Labs    02/01/18 0334  TSH 1.862   Anemia Panel: No results for input(s): VITAMINB12, FOLATE, FERRITIN, TIBC, IRON, RETICCTPCT in the last 72 hours. Sepsis Labs: No results for input(s): PROCALCITON, LATICACIDVEN in the last 168 hours.  No results found for this or any previous visit (from the past 240 hour(s)).       Radiology Studies: Ct Abdomen Pelvis W Contrast  Result Date: 01/31/2018 CLINICAL DATA:  History of melanoma, receiving Nivolumab, recent elevation in liver function tests. EXAM: CT ABDOMEN AND PELVIS WITH CONTRAST TECHNIQUE: Multidetector CT imaging of the abdomen and pelvis was performed using the standard protocol following bolus administration of intravenous contrast. CONTRAST:  115mL ISOVUE-300 IOPAMIDOL (ISOVUE-300) INJECTION 61% COMPARISON:  None. FINDINGS: Lower chest: Three-vessel coronary artery atherosclerosis. Fluid density crescentic structure adjacent to  the distal esophagus at the hiatus on image 19/2, measuring 1.7 by 1.6 cm, significance uncertain. Hepatobiliary: There is diffuse nodularity of the liver compatible with cirrhosis or pseudo cirrhosis. Borderline gallbladder wall thickening. Pancreas: Unremarkable Spleen: Extensive hypodense nodularity of variable size lesions throughout the spleen. An index lesion inferiorly in the spleen measures 2.8 by 2.4 cm on image 34/2. Adrenals/Urinary Tract: The adrenal glands appear normal. Two dominant fluid density cysts of the  right kidney are noted. Stomach/Bowel: Descending and sigmoid colon diverticulosis without compelling findings of metastatic disease to bowel. Appendix normal. Vascular/Lymphatic: Aortoiliac atherosclerotic vascular disease. A porta hepatis lymph node just above the pancreas measures 1.2 cm in short axis on image 34/2, and could be reactive or malignant. Reproductive: The prostate gland measures 5.7 by 4.7 by 4.7 cm (volume = 66 cm^3). There is subcutaneous edema in the pubis extending into the scrotum and potentially the penis. Other: Trace fluid in the right paracolic gutter. Small amount of free pelvic fluid, image 83/2. Musculoskeletal: The confluent edema along the pubis also extends to overlie the right hip region laterally. 30% superior endplate compression fracture at T12, potentially chronic or late subacute. Lower lumbar degenerative facet arthropathy. IMPRESSION: 1. Diffuse low-density nodularity throughout the liver along with variable-sized diffuse low-density nodularity in the spleen. 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 Alternatively this could be true cirrhosis with either splenic metastatic disease or other causes of multiple splenic nodules to include lymphoid neoplasm, Littoral cell angioma, or less likely sarcoidosis. Workup pathways may include tissue diagnosis/biopsy or nuclear medicine PET-CT. 2. Mildly prominent porta hepatis lymph node could be reactive or neoplastic. 3. Abnormal confluent subcutaneous edema along the pubis extending into the scrotum and penis, cause uncertain. Subcutaneous edema also extends lateral to the right hip. 4. Small amount of free pelvic fluid, etiology uncertain. 5. Borderline gallbladder wall thickening. 6.  Aortic Atherosclerosis (ICD10-I70.0). 7. Mild prostatomegaly. 8. 30% superior endplate compression fracture of T12, probably chronic or late subacute. Electronically Signed   By: Van Clines M.D.   On: 01/31/2018 16:16        Scheduled Meds: . allopurinol  300 mg Oral Daily  . chlorhexidine  15 mL Mouth Rinse BID  . enoxaparin (LOVENOX) injection  40 mg Subcutaneous Q24H  . feeding supplement (ENSURE ENLIVE)  237 mL Oral BID BM  . insulin aspart  0-9 Units Subcutaneous Q4H  . mouth rinse  15 mL Mouth Rinse q12n4p  . metoprolol tartrate  25 mg Oral BID  . pantoprazole  40 mg Oral Daily  . predniSONE  60 mg Oral Q breakfast  . senna  1 tablet Oral BID   Continuous Infusions: . dextrose 5 % and 0.45% NaCl 75 mL/hr at 02/01/18 0820     LOS: 2 days    I have spent 20 minutes face to face with the patient and on the ward discussing the patients care, assessment, plan and disposition with other care givers. >50% of the time was devoted counseling the patient about the risks and benefits of treatment and coordinating care.     Nashley Cordoba Arsenio Loader, MD [facility] Pager [phone]   If 7PM-7AM, please contact night-coverage [email or web address] Password [email or web address] 02/02/2018, 10:15 AM

## 2018-02-02 NOTE — Progress Notes (Signed)
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  GOAL:   Patient will meet greater than or equal to 90% of their needs  MONITOR:   PO intake, Supplement acceptance, Labs, Weight trends, I & O's  REASON FOR ASSESSMENT:   Consult Assessment of nutrition requirement/status  ASSESSMENT:   77 year old male with history of diabetes mellitus type 2, essential hypertension, facial melanoma diagnosed in 2018 status post excision with concerning recurrence in March 2019 came to the hospital with complains of fatigue.  Due to the hospital on 6/28 due to failure to thrive and worsening of his liver function test.  Patient currently consuming 50-75% of meals. Pt was not eating well for 2 weeks PTA given abdominal pain and nausea. Pt was unable to tolerate much food. Pt is drinking Ensure supplements. Pt had egg salad, yogurt and soup for dinner last night.  Per weight records, pt has lost 9 lb since 4/20 (6% wt loss x 2 months, significant for time frame).   Medications: D5 -.45% NaCl infusion at 75 ml/hr- provides 306 kcal Labs reviewed: CBGs: 66- 131 Low Na Mg WNL  NUTRITION - FOCUSED PHYSICAL EXAM:    Most Recent Value  Orbital Region  Mild depletion  Upper Arm Region  Moderate depletion  Thoracic and Lumbar Region  Unable to assess  Buccal Region  Mild depletion  Temple Region  Moderate depletion  Clavicle Bone Region  Unable to assess  Clavicle and Acromion Bone Region  Unable to assess  Scapular Bone Region  Unable to assess  Dorsal Hand  Unable to assess  Patellar  Region  Unable to assess  Anterior Thigh Region  Unable to assess  Posterior Calf Region  Unable to assess  Edema (RD Assessment)  None       Diet Order:   Diet Order           Diet Carb Modified Fluid consistency: Thin; Room service appropriate? Yes  Diet effective now          EDUCATION NEEDS:   No education needs have been identified at this time  Skin:  Skin Assessment: Reviewed RN Assessment  Last BM:  6/30  Height:   Ht Readings from Last 1 Encounters:  01/31/18 5\' 6"  (1.676 m)    Weight:   Wt Readings from Last 1 Encounters:  01/31/18 135 lb (61.2 kg)    Ideal Body Weight:  64.5 kg  BMI:  Body mass index is 21.79 kg/m.  Estimated Nutritional Needs:   Kcal:  1700-1900  Protein:  75-85g  Fluid:  1.9L/day   Clayton Bibles, MS, RD, LDN [place] Dietitian Pager: [phone] After Hours Pager: [phone]

## 2018-02-02 NOTE — Progress Notes (Signed)
HEMATOLOGY-ONCOLOGY PROGRESS NOTE  SUBJECTIVE: Patient is feeling slightly better than yesterday.  He had decent appetite and is planning to eat more food today.  He was started on prednisone 1 mg/kg arm yesterday.  OBJECTIVE: REVIEW OF SYSTEMS:   Constitutional: Still extremely fatigued and weak ss Eyes: Denies blurriness of vision Ears, nose, mouth, throat, and face: Denies mucositis or sore throat Respiratory: Denies cough, dyspnea or wheezes Cardiovascular: Denies palpitation, chest discomfort Gastrointestinal:  Denies nausea, heartburn or change in bowel habits Skin: Denies abnormal skin rashes Lymphatics: Denies new lymphadenopathy or easy bruising Neurological:Denies numbness, tingling or new weaknesses Behavioral/Psych: Mood is stable, no new changes  Extremities: No lower extremity edema All other systems were reviewed with the patient and are negative.  PHYSICAL EXAMINATION: ECOG PERFORMANCE STATUS: 3 - Symptomatic, >50% confined to bed  Vitals:   02/01/18 2020 02/02/18 0509  BP: 140/86 (!) 143/83  Pulse: 100 88  Resp: 18 16  Temp: 97.7 F (36.5 C) 97.8 F (36.6 C)  SpO2: 99% 98%   Filed Weights   01/31/18 1608  Weight: 135 lb (61.2 kg)    GENERAL:alert, no distress and comfortable SKIN: skin color, texture, turgor are normal, no rashes or significant lesions EYES: normal, Conjunctiva are pink and non-injected, sclera clear OROPHARYNX:no exudate, no erythema and lips, buccal mucosa, and tongue normal  NECK: supple, thyroid normal size, non-tender, without nodularity LYMPH:  no palpable lymphadenopathy in the cervical, axillary or inguinal LUNGS: clear to auscultation and percussion with normal breathing effort HEART: regular rate & rhythm and no murmurs and no lower extremity edema ABDOMEN:abdomen soft, non-tender and normal bowel sounds Musculoskeletal:no cyanosis of digits and no clubbing  NEURO: alert & oriented x 3 with fluent speech, no focal  motor/sensory deficits  LABORATORY DATA:  I have reviewed the data as listed CMP Latest Ref Rng & Units 02/02/2018 02/01/2018 01/31/2018  Glucose 70 - 99 mg/dL 121(H) 68(L) 85  BUN 8 - 23 mg/dL 23 26(H) 30(H)  Creatinine 0.61 - 1.24 mg/dL 0.96 1.12 1.31(H)  Sodium 135 - 145 mmol/L 132(L) 133(L) 136  Potassium 3.5 - 5.1 mmol/L 4.6 4.3 4.3  Chloride 98 - 111 mmol/L 95(L) 95(L) 93(L)  CO2 22 - 32 mmol/L 21(L) 21(L) 24  Calcium 8.9 - 10.3 mg/dL 9.1 8.7(L) 9.6  Total Protein 6.5 - 8.1 g/dL 5.3(L) 5.0(L) 6.0(L)  Total Bilirubin 0.3 - 1.2 mg/dL 3.9(H) 3.5(H) 4.3(H)  Alkaline Phos 38 - 126 U/L 535(H) 397(H) 509(H)  AST 15 - 41 U/L 274(H) 271(H) 357(H)  ALT 0 - 44 U/L 148(H) 144(H) 170(H)    Lab Results  Component Value Date   WBC 13.0 (H) 02/02/2018   HGB 13.9 02/02/2018   HCT 40.7 02/02/2018   MCV 84.3 02/02/2018   PLT 180 02/02/2018   NEUTROABS 10.5 (H) 01/31/2018    ASSESSMENT AND PLAN: 1.  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  Literature suggests that immunotherapy related elevation of liver function test can take 1 to 2 weeks for improvement.  Sometimes even longer. At this time we will continue to watch and monitor his LFTs. Today's liver function tests did not show any major improvement from yesterday.  In fact some of the numbers went up. At some point we may have to make a decision whether or not we should pursue a liver biopsy.  2. Malignant melanoma was on nivolumab.  Currently treatment is on hold.  We will follow him closely.  Once  his clinical symptoms improved, he may be discharged home for outpatient follow-up of his LFTs on prednisone therapy.  I anticipate that in the next 1 to 2 days he may be able to be discharged home.

## 2018-02-03 ENCOUNTER — Other Ambulatory Visit: Payer: Medicare Other

## 2018-02-03 ENCOUNTER — Inpatient Hospital Stay: Payer: Medicare Other | Attending: Oncology | Admitting: Oncology

## 2018-02-03 DIAGNOSIS — E440 Moderate protein-calorie malnutrition: Secondary | ICD-10-CM

## 2018-02-03 LAB — GLUCOSE, CAPILLARY
GLUCOSE-CAPILLARY: 91 mg/dL (ref 70–99)
GLUCOSE-CAPILLARY: 61 mg/dL — AB (ref 70–99)
GLUCOSE-CAPILLARY: 125 mg/dL — AB (ref 70–99)
GLUCOSE-CAPILLARY: 191 mg/dL — AB (ref 70–99)
GLUCOSE-CAPILLARY: 109 mg/dL — AB (ref 70–99)
GLUCOSE-CAPILLARY: 173 mg/dL — AB (ref 70–99)
GLUCOSE-CAPILLARY: 249 mg/dL — AB (ref 70–99)
GLUCOSE-CAPILLARY: 137 mg/dL — AB (ref 70–99)
Glucose-Capillary: 106 mg/dL — ABNORMAL HIGH (ref 70–99)
Glucose-Capillary: 109 mg/dL — ABNORMAL HIGH (ref 70–99)
Glucose-Capillary: 112 mg/dL — ABNORMAL HIGH (ref 70–99)
Glucose-Capillary: 140 mg/dL — ABNORMAL HIGH (ref 70–99)
Glucose-Capillary: 145 mg/dL — ABNORMAL HIGH (ref 70–99)
Glucose-Capillary: 146 mg/dL — ABNORMAL HIGH (ref 70–99)
Glucose-Capillary: 186 mg/dL — ABNORMAL HIGH (ref 70–99)
Glucose-Capillary: 188 mg/dL — ABNORMAL HIGH (ref 70–99)
Glucose-Capillary: 196 mg/dL — ABNORMAL HIGH (ref 70–99)
Glucose-Capillary: 216 mg/dL — ABNORMAL HIGH (ref 70–99)
Glucose-Capillary: 223 mg/dL — ABNORMAL HIGH (ref 70–99)
Glucose-Capillary: 245 mg/dL — ABNORMAL HIGH (ref 70–99)
Glucose-Capillary: 261 mg/dL — ABNORMAL HIGH (ref 70–99)
Glucose-Capillary: 81 mg/dL (ref 70–99)
Glucose-Capillary: 91 mg/dL (ref 70–99)
Glucose-Capillary: 148 mg/dL — ABNORMAL HIGH (ref 70–99)

## 2018-02-03 LAB — COMPREHENSIVE METABOLIC PANEL
ALT: 198 U/L — AB (ref 0–44)
AST: 427 U/L — AB (ref 15–41)
Albumin: 2.6 g/dL — ABNORMAL LOW (ref 3.5–5.0)
Alkaline Phosphatase: 714 U/L — ABNORMAL HIGH (ref 38–126)
Anion gap: 17 — ABNORMAL HIGH (ref 5–15)
BUN: 27 mg/dL — AB (ref 8–23)
CALCIUM: 9.7 mg/dL (ref 8.9–10.3)
CHLORIDE: 96 mmol/L — AB (ref 98–111)
CO2: 20 mmol/L — ABNORMAL LOW (ref 22–32)
CREATININE: 0.9 mg/dL (ref 0.61–1.24)
Glucose, Bld: 120 mg/dL — ABNORMAL HIGH (ref 70–99)
Potassium: 4.7 mmol/L (ref 3.5–5.1)
Sodium: 133 mmol/L — ABNORMAL LOW (ref 135–145)
Total Bilirubin: 5 mg/dL — ABNORMAL HIGH (ref 0.3–1.2)
Total Protein: 5.3 g/dL — ABNORMAL LOW (ref 6.5–8.1)

## 2018-02-03 LAB — CBC
HCT: 43.2 % (ref 39.0–52.0)
Hemoglobin: 14.3 g/dL (ref 13.0–17.0)
MCH: 27.6 pg (ref 26.0–34.0)
MCHC: 33.1 g/dL (ref 30.0–36.0)
MCV: 83.4 fL (ref 78.0–100.0)
Platelets: 197 10*3/uL (ref 150–400)
RBC: 5.18 MIL/uL (ref 4.22–5.81)
RDW: 17.5 % — AB (ref 11.5–15.5)
WBC: 17.6 10*3/uL — AB (ref 4.0–10.5)

## 2018-02-03 LAB — HEPATITIS PANEL, ACUTE
Hep A IgM: NEGATIVE
Hep B C IgM: NEGATIVE
Hepatitis B Surface Ag: NEGATIVE

## 2018-02-03 LAB — ANTINUCLEAR ANTIBODIES, IFA: ANTINUCLEAR ANTIBODIES, IFA: NEGATIVE

## 2018-02-03 LAB — MAGNESIUM: MAGNESIUM: 2 mg/dL (ref 1.7–2.4)

## 2018-02-03 MED ORDER — PREDNISONE 50 MG PO TABS
120.0000 mg | ORAL_TABLET | Freq: Every day | ORAL | Status: DC
  Administered 2018-02-03 – 2018-02-04 (×2): 120 mg via ORAL
  Filled 2018-02-03 (×2): qty 2

## 2018-02-03 NOTE — Care Management Important Message (Signed)
Important Message  Patient Details  Name: DALON REICHART MRN: [id number] Date of Birth: 07/17/1941   Medicare Important Message Given:  Yes    Kerin Salen 02/03/2018, 11:56 AMImportant Message  Patient Details  Name: DELEON PASSE MRN: [id number] Date of Birth: 1940-10-31   Medicare Important Message Given:  Yes    Kerin Salen 02/03/2018, 11:56 AM

## 2018-02-03 NOTE — Progress Notes (Signed)
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  Due to the hospital on 6/28 due to failure to thrive and worsening of his liver function test.  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  It was thought that his transaminitis was likely secondary to drug-induced hepatitis, oncology was consulted.   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  Suspect he may have drug-induced hepatitis -LFTs continue to rise up therefore his prednisone has been changed to 2 mg/kg. -Hepatitis panel-negative, anti-smooth muscle antibody-negative.  ANA is pending -If no improvement noted in his LFTs in next 24 hours, we will schedule him for liver biopsy. -Pain control, antiemetics, supportive care -Nutrition consult  Mild dehydration - Doing well.  This is resolved.  Diabetes mellitus type 2 -Insulin sliding scale.  Holding glimepiride, metformin and Actos.   Subacute compression fracture-T12 -Pain control, physical therapy but patient is not much interested at this time  Essential hypertension -Amlodipine 10 mg daily is on hold.  Metoprolol 50 mg daily  GERD -PPI daily  DVT  prophylaxis: Lovenox Code Status: Full code Family Communication: None at bedside  Disposition Plan: Maintain inpatient status closely monitor his LFTs.  Consultants:   Oncology  Procedures:   None  Antimicrobials:   None   Subjective: Eating breakfast this morning without any complaints.  No acute events overnight.  Review of Systems Otherwise negative except as per HPI, including: General = no fevers, chills, dizziness, malaise, fatigue HEENT/EYES = negative for pain, redness, loss of vision, double vision, blurred vision, loss of hearing, sore throat, hoarseness, dysphagia Cardiovascular= negative for chest pain, palpitation, murmurs, lower extremity swelling Respiratory/lungs= negative for shortness of breath, cough, hemoptysis, wheezing, mucus production Gastrointestinal= negative for nausea, vomiting,, abdominal pain, melena, hematemesis Genitourinary= negative for Dysuria, Hematuria, Change in Urinary Frequency MSK = Negative for arthralgia, myalgias, Back Pain, Joint swelling  Neurology= Negative for headache, seizures, numbness, tingling  Psychiatry= Negative for anxiety, depression, suicidal and homocidal ideation Allergy/Immunology= Medication/Food allergy as listed  Skin= Negative for Rash, lesions, ulcers, itching   Objective: Vitals:   02/02/18 2058 02/02/18 2353 02/03/18 0504 02/03/18 1104  BP: (!) 144/88 128/77 131/79 126/77  Pulse: 95 89 90 71  Resp: 20 20 18 14   Temp: 97.7 F (36.5 C)  97.9 F (36.6 C) 98.2 F (36.8 C)  TempSrc: Oral  Oral Oral  SpO2: 97% 98% 96% 96%  Weight:      Height:        Intake/Output Summary (Last 24 hours) at 02/03/2018 1131 Last data filed at 02/03/2018 1102 Gross per 24 hour  Intake 3260 ml  Output 0 ml  Net 3260 ml   Filed Weights   01/31/18 1608  Weight: 61.2 kg (135  lb)    Examination:  Constitutional: NAD, calm, comfortable, cachectic frail-appearing with bilateral temporal wasting. Eyes: PERRL, lids and  conjunctivae normal, positive scleral icterus ENMT: Mucous membranes are moist. Posterior pharynx clear of any exudate or lesions.Normal dentition.  Neck: normal, supple, no masses, no thyromegaly Respiratory: clear to auscultation bilaterally, no wheezing, no crackles. Normal respiratory effort. No accessory muscle use.  Cardiovascular: Regular rate and rhythm, no murmurs / rubs / gallops. No extremity edema. 2+ pedal pulses. No carotid bruits.  Abdomen: no tenderness, no masses palpated. No hepatosplenomegaly. Bowel sounds positive.  Musculoskeletal: no clubbing / cyanosis. No joint deformity upper and lower extremities. Good ROM, no contractures. Normal muscle tone.  Skin: no rashes, lesions, ulcers. No induration Neurologic: CN 2-12 grossly intact. Sensation intact, DTR normal. Strength 5/5 in all 4.  Psychiatric: Normal judgment and insight. Alert and oriented x 3. Normal mood.   Data Reviewed:   CBC: Recent Labs  Lab 01/31/18 1318 02/01/18 0334 02/02/18 0759 02/03/18 0434  WBC 13.1* 10.9* 13.0* 17.6*  NEUTROABS 10.5*  --   --   --   HGB 14.9 13.0 13.9 14.3  HCT 44.7 38.3* 40.7 43.2  MCV 85.0 83.4 84.3 83.4  PLT 183 161 180 263   Basic Metabolic Panel: Recent Labs  Lab 01/31/18 1310 02/01/18 0334 02/02/18 0759 02/03/18 0434  NA 136 133* 132* 133*  K 4.3 4.3 4.6 4.7  CL 93* 95* 95* 96*  CO2 24 21* 21* 20*  GLUCOSE 85 68* 121* 120*  BUN 30* 26* 23 27*  CREATININE 1.31* 1.12 0.96 0.90  CALCIUM 9.6 8.7* 9.1 9.7  MG  --  1.6* 1.8 2.0  PHOS  --  2.4*  --   --    GFR: Estimated Creatinine Clearance: 61.4 mL/min (by C-G formula based on SCr of 0.9 mg/dL). Liver Function Tests: Recent Labs  Lab 01/31/18 1310 02/01/18 0334 02/02/18 0759 02/03/18 0434  AST 357* 271* 274* 427*  ALT 170* 144* 148* 198*  ALKPHOS 509* 397* 535* 714*  BILITOT 4.3* 3.5* 3.9* 5.0*  PROT 6.0* 5.0* 5.3* 5.3*  ALBUMIN 2.9* 2.5* 2.5* 2.6*   Recent Labs  Lab 01/31/18 1759  LIPASE 28    No results for input(s): AMMONIA in the last 168 hours. Coagulation Profile: Recent Labs  Lab 02/01/18 0334  INR 1.33   Cardiac Enzymes: No results for input(s): CKTOTAL, CKMB, CKMBINDEX, TROPONINI in the last 168 hours. BNP (last 3 results) No results for input(s): PROBNP in the last 8760 hours. HbA1C: Recent Labs    02/01/18 0334  HGBA1C 5.9*   CBG: Recent Labs  Lab 02/02/18 1712 02/02/18 2059 02/02/18 2351 02/03/18 0537 02/03/18 0838  GLUCAP 216* 188* 145* 112* 106*   Lipid Profile: No results for input(s): CHOL, HDL, LDLCALC, TRIG, CHOLHDL, LDLDIRECT in the last 72 hours. Thyroid Function Tests: Recent Labs    02/01/18 0334  TSH 1.862   Anemia Panel: No results for input(s): VITAMINB12, FOLATE, FERRITIN, TIBC, IRON, RETICCTPCT in the last 72 hours. Sepsis Labs: No results for input(s): PROCALCITON, LATICACIDVEN in the last 168 hours.  No results found for this or any previous visit (from the past 240 hour(s)).       Radiology Studies: No results found.      Scheduled Meds: . allopurinol  300 mg Oral Daily  . chlorhexidine  15 mL Mouth Rinse BID  . enoxaparin (LOVENOX) injection  40 mg Subcutaneous Q24H  . feeding supplement (ENSURE ENLIVE)  237 mL Oral  BID BM  . insulin aspart  0-9 Units Subcutaneous Q4H  . mouth rinse  15 mL Mouth Rinse q12n4p  . metoprolol tartrate  25 mg Oral BID  . pantoprazole  40 mg Oral Daily  . predniSONE  120 mg Oral Q breakfast  . senna  1 tablet Oral BID   Continuous Infusions: . dextrose 5 % and 0.45% NaCl 75 mL/hr at 02/02/18 1358     LOS: 3 days    I have spent 15 minutes face to face with the patient and on the ward discussing the patients care, assessment, plan and disposition with other care givers. >50% of the time was devoted counseling the patient about the risks and benefits of treatment and coordinating care.     Ankit Arsenio Loader, MD [facility] Pager [phone]   If 7PM-7AM,  please contact night-coverage [email or web address] Password [email or web address] 02/03/2018, 11:31 AM

## 2018-02-03 NOTE — Progress Notes (Signed)
Hematology oncology Labs reviewed: There is been an increase in his liver function tests. It is not uncommon for immunotherapy related hepatitis to have fluctuations in AST and ALT as well as alkaline phosphatase and bilirubin. I will increase the dosage of prednisone to 2 mg/kg. If it continues to escalate, I will request for a liver biopsy.

## 2018-02-04 ENCOUNTER — Telehealth: Payer: Self-pay | Admitting: *Deleted

## 2018-02-04 ENCOUNTER — Other Ambulatory Visit: Payer: Self-pay | Admitting: *Deleted

## 2018-02-04 DIAGNOSIS — C787 Secondary malignant neoplasm of liver and intrahepatic bile duct: Secondary | ICD-10-CM

## 2018-02-04 LAB — COMPREHENSIVE METABOLIC PANEL
ALBUMIN: 2.3 g/dL — AB (ref 3.5–5.0)
ALK PHOS: 663 U/L — AB (ref 38–126)
ALT: 254 U/L — AB (ref 0–44)
AST: 530 U/L — AB (ref 15–41)
Anion gap: 20 — ABNORMAL HIGH (ref 5–15)
BILIRUBIN TOTAL: 5.6 mg/dL — AB (ref 0.3–1.2)
BUN: 34 mg/dL — AB (ref 8–23)
CALCIUM: 9.6 mg/dL (ref 8.9–10.3)
CO2: 18 mmol/L — AB (ref 22–32)
CREATININE: 0.98 mg/dL (ref 0.61–1.24)
Chloride: 97 mmol/L — ABNORMAL LOW (ref 98–111)
GFR calc Af Amer: >60 mL/min (ref >60)
GFR calc non Af Amer: >60 mL/min (ref >60)
GLUCOSE: 93 mg/dL (ref 70–99)
Potassium: 5.1 mmol/L (ref 3.5–5.1)
SODIUM: 135 mmol/L (ref 135–145)
TOTAL PROTEIN: 4.9 g/dL — AB (ref 6.5–8.1)

## 2018-02-04 LAB — CBC
HCT: 42.2 % (ref 39.0–52.0)
Hemoglobin: 14.2 g/dL (ref 13.0–17.0)
MCH: 28.3 pg (ref 26.0–34.0)
MCHC: 33.6 g/dL (ref 30.0–36.0)
MCV: 84.1 fL (ref 78.0–100.0)
Platelets: 171 10*3/uL (ref 150–400)
RBC: 5.02 MIL/uL (ref 4.22–5.81)
RDW: 17.4 % — AB (ref 11.5–15.5)
WBC: 17.5 10*3/uL — ABNORMAL HIGH (ref 4.0–10.5)

## 2018-02-04 LAB — MAGNESIUM: MAGNESIUM: 2.1 mg/dL (ref 1.7–2.4)

## 2018-02-04 LAB — GLUCOSE, CAPILLARY
GLUCOSE-CAPILLARY: 147 mg/dL — AB (ref 70–99)
GLUCOSE-CAPILLARY: 150 mg/dL — AB (ref 70–99)
Glucose-Capillary: 95 mg/dL (ref 70–99)
Glucose-Capillary: 86 mg/dL (ref 70–99)

## 2018-02-04 MED ORDER — PREDNISONE 20 MG PO TABS: 120.0000 mg | ORAL_TABLET | Freq: Every day | ORAL | 0 refills | Status: AC

## 2018-02-04 MED ORDER — ONDANSETRON 8 MG PO TBDP: 8.0000 mg | ORAL_TABLET | Freq: Three times a day (TID) | ORAL | 0 refills | Status: AC | PRN

## 2018-02-04 NOTE — Telephone Encounter (Signed)
Per dr Lindi Adie, hospital patient to be discharged today. schedule message sent for lab and visit with kristin curcio 02/07/18

## 2018-02-04 NOTE — Discharge Summary (Addendum)
Physician Discharge Summary  DAVINCI GLOTFELTY SNK:[id number] DOB: 1941-01-08 DOA: 01/31/2018  PCP: [name], MD  Admit date: 01/31/2018 Discharge date: 02/04/2018  Admitted From: Home Disposition: Home  Recommendations for Outpatient Follow-up:  1. Follow up with PCP in 1-2 weeks 2. Follow with outpatient oncology this coming Friday and have CMP checked. 3. Patient was discharged on steroids 2 mg/kg orally to be taken daily.  Discharge Condition: Stable CODE STATUS: Full  Diet recommendation: Diabetic  Brief/Interim Summary:  77 year old male with history of diabetes mellitus type 2, essential hypertension, facial melanoma diagnosed in 2018 status post excision with concerning recurrence in March 2019 came to the hospital with complains of fatigue.  Due to the hospital on 6/28 due to failure to thrive and worsening of his liver function test.  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  It was thought that his transaminitis was likely secondary to drug-induced hepatitis, oncology was consulted.  Oncology recommended starting the patient on prednisone 1 mg/kg at first but the dose was increased due to persistent elevation of LFTs.  After further discussion it was determined that we will continue oral prednisone for now and will get repeat LFTs checked in 3 days and outpatient oncology office.  Further determination will be made at that time for liver biopsy.  Case was discussed by me with Dr. Lindi Adie on the day of discharge. I also discussed this with the patient and he is in the agreement and understands his plan of care.  At this point patient has reached maximum benefit from an hospital stay and stable to be discharged with outpatient follow-up recommendations as stated above.    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  Still elevated. - CT of the abdomen pelvis shows progression of his chronic disease otherwise nothing acute at this time.  Suspect he may have drug-induced hepatitis -Patient's prednisone has been changed to 2 mg/kg which he will be discharged on.  He will follow-up with outpatient oncology in 3 days and will get repeat CMP.  At that time further determination to get liver biopsy will be made. -Hepatitis panel-negative, anti-smooth muscle antibody-negative.  ANA - neg  Mild dehydration -  Resolved with IV fluids  Diabetes mellitus type 2 -Resume his home meds on discharge.  Advised him to hold medications if necessary if his diet remains poor.  Advised him to check his blood glucose 4 times a day, 3 times before and/or after meals and once before bedtime.  Subacute compression fracture-T12 -Pain control, physical therapy but patient is not much interested at this time  Essential hypertension -Amlodipine 10 mg daily is on hold.  Metoprolol 50 mg daily  GERD -PPI daily  DVT prophylaxis: Lovenox Code Status: Full code Family Communication: None at bedside  Disposition Plan:  Discharge today    Discharge Instructions   Allergies as of 02/04/2018   No Known Allergies     Medication List    STOP taking these medications   ondansetron 8 MG tablet Commonly known as:  ZOFRAN     TAKE these medications   allopurinol 300 MG tablet Commonly known as:  ZYLOPRIM Take 300 mg by mouth daily.   amLODipine 10 MG tablet Commonly known as:  NORVASC Take 10 mg by mouth daily.   aspirin EC 81 MG tablet Take 81 mg by mouth daily.   colchicine 0.6 MG tablet Commonly known as:  COLCRYS Take 1 tablet (0.6 mg total) by mouth daily as  needed (for gout flares).   glimepiride 2 MG tablet Commonly known as:  AMARYL Take 2 mg by mouth daily with breakfast.   HYDROcodone-acetaminophen 5-325 MG tablet Commonly known as:  NORCO/VICODIN Take 1-2 tablets by mouth every 4 (four) hours as needed for moderate pain.   indapamide 2.5 MG tablet Commonly known as:  LOZOL Take 1 tablet (2.5 mg total) by mouth daily.   LORazepam 0.5 MG tablet Commonly known as:  ATIVAN Take 0.5 mg by mouth daily as needed for anxiety.   metFORMIN 1000 MG tablet Commonly known as:  GLUCOPHAGE Take 1,000 mg by mouth 2 (two) times daily with a meal.   metoprolol succinate 50 MG 24 hr tablet Commonly known as:  TOPROL-XL Take 50 mg by mouth daily. Take with or immediately following a meal.   omeprazole 20 MG capsule Commonly known as:  PRILOSEC Take 20 mg by mouth daily before breakfast.   ondansetron 8 MG disintegrating tablet Commonly known as:  ZOFRAN ODT Take 1 tablet (8 mg total) by mouth every 8 (eight) hours as needed for nausea or vomiting.   pioglitazone 30 MG tablet Commonly known as:  ACTOS Take 30 mg by mouth daily.   predniSONE 20 MG tablet Commonly known as:  DELTASONE Take 6 tablets (120 mg total) by mouth daily with breakfast for 15 days. Start taking on:  02/04/2018   sildenafil 20 MG tablet Commonly known as:  REVATIO Take 40-100 mg by mouth daily as needed (erectile dysfunction).      Follow-up Information    [name], MD. Schedule an appointment as soon as possible for a visit in 1 week(s).   Specialty:  Family Medicine Contact information: [place] [address] [phone]        Nicholas Lose, MD Follow up on 02/07/2018.   Specialty:  Hematology and Oncology Contact information: [place] [id number] [phone]          No Known Allergies  You were cared for by a hospitalist during your hospital stay. If you have any questions  about your discharge medications or the care you received while you were in the hospital after you are discharged, you can call the unit and asked to speak with the hospitalist on call if the hospitalist that took care of you is not available. Once you are discharged, your primary care physician will handle any further medical issues. Please note that no refills for any discharge medications will be authorized once you are discharged, as it is imperative that you return to your primary care physician (or establish a relationship with a primary care physician if you do not have one) for your aftercare needs so that they can reassess your need for medications and monitor your lab values.  Consultations:  Oncology   Procedures/Studies: Ct Abdomen Pelvis W Contrast  Result Date: 01/31/2018 CLINICAL DATA:  History of melanoma, receiving Nivolumab, recent elevation in liver function tests. EXAM: CT ABDOMEN AND PELVIS WITH CONTRAST TECHNIQUE: Multidetector CT imaging of the abdomen and pelvis was performed using the standard protocol following bolus administration of intravenous contrast. CONTRAST:  174mL ISOVUE-300 IOPAMIDOL (ISOVUE-300) INJECTION 61% COMPARISON:  None. FINDINGS: Lower chest: Three-vessel coronary artery atherosclerosis. Fluid density crescentic structure adjacent to the distal esophagus at the hiatus on image 19/2, measuring 1.7 by 1.6 cm, significance uncertain. Hepatobiliary: There is diffuse nodularity of the liver compatible with cirrhosis or pseudo cirrhosis. Borderline gallbladder wall thickening. Pancreas: Unremarkable Spleen: Extensive hypodense nodularity of variable size lesions throughout the spleen. An index lesion inferiorly in the spleen measures 2.8 by 2.4 cm on image 34/2. Adrenals/Urinary Tract: The adrenal glands appear normal. Two dominant fluid density cysts of the right kidney are noted. Stomach/Bowel: Descending and sigmoid colon diverticulosis without compelling findings  of metastatic disease to bowel. Appendix normal. Vascular/Lymphatic: Aortoiliac atherosclerotic vascular disease. A porta hepatis lymph node just above the pancreas measures 1.2 cm in short axis on image 34/2, and could be reactive or malignant. Reproductive: The prostate gland measures 5.7 by 4.7 by 4.7 cm (volume = 66 cm^3). There is subcutaneous edema in the pubis extending into the scrotum and potentially the penis. Other: Trace fluid in the right paracolic gutter. Small amount of free pelvic fluid, image 83/2. Musculoskeletal: The confluent edema along the pubis also extends to overlie the right hip region laterally. 30% superior endplate compression fracture at T12, potentially chronic or late subacute. Lower lumbar degenerative facet arthropathy. IMPRESSION: 1. Diffuse low-density nodularity throughout the liver along with variable-sized diffuse low-density nodularity in the spleen. 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 Alternatively this could be true cirrhosis with either splenic metastatic disease or other causes of multiple splenic nodules to include lymphoid neoplasm, Littoral cell angioma, or less likely sarcoidosis. Workup pathways may include tissue diagnosis/biopsy or nuclear medicine PET-CT. 2. Mildly prominent porta hepatis lymph node could be reactive or neoplastic. 3. Abnormal confluent subcutaneous edema along the pubis extending into the scrotum and penis, cause uncertain. Subcutaneous edema also extends lateral to the right hip. 4. Small amount of free pelvic fluid, etiology uncertain. 5. Borderline gallbladder wall thickening. 6.  Aortic Atherosclerosis (ICD10-I70.0). 7. Mild prostatomegaly. 8. 30% superior endplate compression fracture of T12, probably chronic or late subacute. Electronically Signed   By: Van Clines M.D.   On: 01/31/2018 16:16     Subjective: Patient is any complaints.  He is walking around  outside in the hallway without any issues.  General = no fevers, chills, dizziness, malaise, fatigue HEENT/EYES = negative for pain, redness, loss of vision, double vision, blurred vision, loss of hearing, sore throat, hoarseness, dysphagia Cardiovascular= negative for chest pain, palpitation, murmurs, lower extremity swelling Respiratory/lungs= negative for shortness of breath, cough, hemoptysis, wheezing, mucus production Gastrointestinal= negative for nausea, vomiting,, abdominal pain, melena, hematemesis Genitourinary= negative for Dysuria, Hematuria, Change in Urinary Frequency MSK = Negative for arthralgia, myalgias, Back Pain, Joint swelling  Neurology= Negative for headache, seizures, numbness, tingling  Psychiatry= Negative for anxiety, depression, suicidal and homocidal ideation Allergy/Immunology= Medication/Food allergy as listed  Skin= Negative for Rash, lesions, ulcers, itching   Discharge Exam: Vitals:   02/03/18 2014 02/04/18 0416  BP: (!) 139/96 (!) 144/84  Pulse: 86 87  Resp: 18 18  Temp: 97.9 F (36.6 C) 98 F (36.7 C)  SpO2: 95% 97%   Vitals:   02/03/18 1104 02/03/18 1417 02/03/18 2014 02/04/18 0416  BP: 126/77 135/81 (!) 139/96 (!) 144/84  Pulse: 71 83 86 87  Resp: 14 14 18 18   Temp: 98.2 F (36.8 C) 97.8 F (36.6 C) 97.9 F (36.6 C) 98 F (  36.7 C)  TempSrc: Oral Oral Oral Oral  SpO2: 96% 95% 95% 97%  Weight:      Height:        General: Pt is alert, awake, not in acute distress; elderly frail  Cardiovascular: RRR, S1/S2 +, no rubs, no gallops Respiratory: CTA bilaterally, no wheezing, no rhonchi Abdominal: Soft, NT, ND, bowel sounds + Extremities: no edema, no cyanosis    The results of significant diagnostics from this hospitalization (including imaging, microbiology, ancillary and laboratory) are listed below for reference.     Microbiology: No results found for this or any previous visit (from the past 240 hour(s)).   Labs: BNP (last 3  results) No results for input(s): BNP in the last 8760 hours. Basic Metabolic Panel: Recent Labs  Lab 01/31/18 1310 02/01/18 0334 02/02/18 0759 02/03/18 0434 02/04/18 0420  NA 136 133* 132* 133* 135  K 4.3 4.3 4.6 4.7 5.1  CL 93* 95* 95* 96* 97*  CO2 24 21* 21* 20* 18*  GLUCOSE 85 68* 121* 120* 93  BUN 30* 26* 23 27* 34*  CREATININE 1.31* 1.12 0.96 0.90 0.98  CALCIUM 9.6 8.7* 9.1 9.7 9.6  MG  --  1.6* 1.8 2.0 2.1  PHOS  --  2.4*  --   --   --    Liver Function Tests: Recent Labs  Lab 01/31/18 1310 02/01/18 0334 02/02/18 0759 02/03/18 0434 02/04/18 0420  AST 357* 271* 274* 427* 530*  ALT 170* 144* 148* 198* 254*  ALKPHOS 509* 397* 535* 714* 663*  BILITOT 4.3* 3.5* 3.9* 5.0* 5.6*  PROT 6.0* 5.0* 5.3* 5.3* 4.9*  ALBUMIN 2.9* 2.5* 2.5* 2.6* 2.3*   Recent Labs  Lab 01/31/18 1759  LIPASE 28   No results for input(s): AMMONIA in the last 168 hours. CBC: Recent Labs  Lab 01/31/18 1318 02/01/18 0334 02/02/18 0759 02/03/18 0434 02/04/18 0420  WBC 13.1* 10.9* 13.0* 17.6* 17.5*  NEUTROABS 10.5*  --   --   --   --   HGB 14.9 13.0 13.9 14.3 14.2  HCT 44.7 38.3* 40.7 43.2 42.2  MCV 85.0 83.4 84.3 83.4 84.1  PLT 183 161 180 197 171   Cardiac Enzymes: No results for input(s): CKTOTAL, CKMB, CKMBINDEX, TROPONINI in the last 168 hours. BNP: Invalid input(s): POCBNP CBG: Recent Labs  Lab 02/03/18 2011 02/04/18 0010 02/04/18 0414 02/04/18 0730 02/04/18 1132  GLUCAP 148* 150* 95 86 147*   D-Dimer No results for input(s): DDIMER in the last 72 hours. Hgb A1c No results for input(s): HGBA1C in the last 72 hours. Lipid Profile No results for input(s): CHOL, HDL, LDLCALC, TRIG, CHOLHDL, LDLDIRECT in the last 72 hours. Thyroid function studies No results for input(s): TSH, T4TOTAL, T3FREE, THYROIDAB in the last 72 hours.  Invalid input(s): FREET3 Anemia work up No results for input(s): VITAMINB12, FOLATE, FERRITIN, TIBC, IRON, RETICCTPCT in the last 72  hours. Urinalysis    Component Value Date/Time   COLORURINE AMBER (A) 01/31/2018 1759   APPEARANCEUR CLEAR 01/31/2018 1759   LABSPEC 1.045 (H) 01/31/2018 1759   PHURINE 5.0 01/31/2018 1759   GLUCOSEU NEGATIVE 01/31/2018 1759   HGBUR SMALL (A) 01/31/2018 1759   BILIRUBINUR MODERATE (A) 01/31/2018 1759   KETONESUR 20 (A) 01/31/2018 1759   PROTEINUR NEGATIVE 01/31/2018 1759   NITRITE NEGATIVE 01/31/2018 1759   LEUKOCYTESUR NEGATIVE 01/31/2018 1759   Sepsis Labs Invalid input(s): PROCALCITONIN,  WBC,  LACTICIDVEN Microbiology No results found for this or any previous visit (from the past 240  hour(s)).   Time coordinating discharge:  I have spent 35 minutes face to face with the patient and on the ward discussing the patients care, assessment, plan and disposition with other care givers. >50% of the time was devoted counseling the patient about the risks and benefits of treatment/Discharge disposition and coordinating care.   SIGNED:   Damita Lack, MD  [facility] 02/04/2018, 1:16 PM Pager   If 7PM-7AM, please contact night-coverage [email or web address] Password [email or web address]

## 2018-02-04 NOTE — Progress Notes (Signed)
Requested to compare patient's costs for predniSOne vs predniSOLOne, 120 mg PO daily x 3 weeks. PredniSOne=$15, PredniSOLOne=$20. Attending notified. [phone]

## 2018-02-05 ENCOUNTER — Emergency Department (HOSPITAL_COMMUNITY): Payer: Medicare Other

## 2018-02-05 ENCOUNTER — Other Ambulatory Visit: Payer: Self-pay

## 2018-02-05 ENCOUNTER — Inpatient Hospital Stay (HOSPITAL_COMMUNITY)
Admission: EM | Admit: 2018-02-05 | Discharge: 2018-03-06 | DRG: 442 | Disposition: E | Payer: Medicare Other | Attending: Internal Medicine | Admitting: Internal Medicine

## 2018-02-05 ENCOUNTER — Other Ambulatory Visit: Payer: Self-pay | Admitting: Oncology

## 2018-02-05 ENCOUNTER — Telehealth: Payer: Self-pay | Admitting: Oncology

## 2018-02-05 DIAGNOSIS — I10 Essential (primary) hypertension: Secondary | ICD-10-CM

## 2018-02-05 DIAGNOSIS — E0800 Diabetes mellitus due to underlying condition with hyperosmolarity without nonketotic hyperglycemic-hyperosmolar coma (NKHHC): Secondary | ICD-10-CM | POA: Diagnosis not present

## 2018-02-05 DIAGNOSIS — E872 Acidosis, unspecified: Secondary | ICD-10-CM | POA: Diagnosis present

## 2018-02-05 DIAGNOSIS — K7200 Acute and subacute hepatic failure without coma: Secondary | ICD-10-CM | POA: Diagnosis not present

## 2018-02-05 DIAGNOSIS — Z66 Do not resuscitate: Secondary | ICD-10-CM | POA: Diagnosis present

## 2018-02-05 DIAGNOSIS — K7290 Hepatic failure, unspecified without coma: Secondary | ICD-10-CM | POA: Diagnosis not present

## 2018-02-05 DIAGNOSIS — M109 Gout, unspecified: Secondary | ICD-10-CM | POA: Diagnosis present

## 2018-02-05 DIAGNOSIS — Z833 Family history of diabetes mellitus: Secondary | ICD-10-CM

## 2018-02-05 DIAGNOSIS — Z7984 Long term (current) use of oral hypoglycemic drugs: Secondary | ICD-10-CM

## 2018-02-05 DIAGNOSIS — C787 Secondary malignant neoplasm of liver and intrahepatic bile duct: Secondary | ICD-10-CM

## 2018-02-05 DIAGNOSIS — Z515 Encounter for palliative care: Secondary | ICD-10-CM | POA: Diagnosis present

## 2018-02-05 DIAGNOSIS — Z79899 Other long term (current) drug therapy: Secondary | ICD-10-CM

## 2018-02-05 DIAGNOSIS — Z7982 Long term (current) use of aspirin: Secondary | ICD-10-CM

## 2018-02-05 DIAGNOSIS — R0602 Shortness of breath: Secondary | ICD-10-CM | POA: Diagnosis not present

## 2018-02-05 DIAGNOSIS — E162 Hypoglycemia, unspecified: Secondary | ICD-10-CM | POA: Diagnosis not present

## 2018-02-05 DIAGNOSIS — E119 Type 2 diabetes mellitus without complications: Secondary | ICD-10-CM

## 2018-02-05 DIAGNOSIS — C439 Malignant melanoma of skin, unspecified: Secondary | ICD-10-CM | POA: Diagnosis present

## 2018-02-05 DIAGNOSIS — E875 Hyperkalemia: Secondary | ICD-10-CM

## 2018-02-05 DIAGNOSIS — Z87891 Personal history of nicotine dependence: Secondary | ICD-10-CM

## 2018-02-05 DIAGNOSIS — E860 Dehydration: Secondary | ICD-10-CM | POA: Diagnosis present

## 2018-02-05 DIAGNOSIS — Z7401 Bed confinement status: Secondary | ICD-10-CM

## 2018-02-05 DIAGNOSIS — E11649 Type 2 diabetes mellitus with hypoglycemia without coma: Secondary | ICD-10-CM | POA: Diagnosis present

## 2018-02-05 LAB — I-STAT TROPONIN, ED: Troponin i, poc: 0.01 ng/mL (ref 0.00–0.08)

## 2018-02-05 LAB — BLOOD GAS, VENOUS
O2 CONTENT: 3 L/min
O2 Saturation: 85 %
Patient temperature: 95.3
pH, Ven: 6.884 — CL (ref 7.250–7.430)
pO2, Ven: 92.1 mmHg — ABNORMAL HIGH (ref 32.0–45.0)

## 2018-02-05 LAB — TSH: TSH: 0.861 u[IU]/mL (ref 0.350–4.500)

## 2018-02-05 LAB — CBC WITH DIFFERENTIAL/PLATELET
Basophils Absolute: 0.3 10*3/uL — ABNORMAL HIGH (ref 0.0–0.1)
Basophils Relative: 1 %
EOS PCT: 0 %
Eosinophils Absolute: 0 10*3/uL (ref 0.0–0.7)
HEMATOCRIT: 48.9 % (ref 39.0–52.0)
HEMOGLOBIN: 14.5 g/dL (ref 13.0–17.0)
LYMPHS ABS: 2.4 10*3/uL (ref 0.7–4.0)
Lymphocytes Relative: 9 %
MCH: 27.8 pg (ref 26.0–34.0)
MCHC: 29.7 g/dL — AB (ref 30.0–36.0)
MCV: 93.7 fL (ref 78.0–100.0)
MONOS PCT: 7 %
Monocytes Absolute: 1.9 10*3/uL — ABNORMAL HIGH (ref 0.1–1.0)
NEUTROS ABS: 21.9 10*3/uL — AB (ref 1.7–7.7)
Neutrophils Relative %: 83 %
Platelets: 152 10*3/uL (ref 150–400)
RBC: 5.22 MIL/uL (ref 4.22–5.81)
RDW: 17.8 % — ABNORMAL HIGH (ref 11.5–15.5)
WBC: 26.5 10*3/uL — AB (ref 4.0–10.5)

## 2018-02-05 LAB — I-STAT CHEM 8, ED
BUN: 58 mg/dL — AB (ref 8–23)
CALCIUM ION: 1.15 mmol/L (ref 1.15–1.40)
CHLORIDE: 100 mmol/L (ref 98–111)
Creatinine, Ser: 1.4 mg/dL — ABNORMAL HIGH (ref 0.61–1.24)
Glucose, Bld: 163 mg/dL — ABNORMAL HIGH (ref 70–99)
HEMATOCRIT: 53 % — AB (ref 39.0–52.0)
Hemoglobin: 18 g/dL — ABNORMAL HIGH (ref 13.0–17.0)
POTASSIUM: 6.1 mmol/L — AB (ref 3.5–5.1)
Sodium: 132 mmol/L — ABNORMAL LOW (ref 135–145)
TCO2: 6 mmol/L — ABNORMAL LOW (ref 22–32)

## 2018-02-05 LAB — COMPREHENSIVE METABOLIC PANEL
ALBUMIN: 2.6 g/dL — AB (ref 3.5–5.0)
ALT: 498 U/L — ABNORMAL HIGH (ref 0–44)
AST: 1305 U/L — AB (ref 15–41)
Alkaline Phosphatase: 757 U/L — ABNORMAL HIGH (ref 38–126)
BUN: 58 mg/dL — AB (ref 8–23)
CHLORIDE: 93 mmol/L — AB (ref 98–111)
CO2: <7 mmol/L — ABNORMAL LOW (ref 22–32)
Calcium: 10.3 mg/dL (ref 8.9–10.3)
Creatinine, Ser: 1.37 mg/dL — ABNORMAL HIGH (ref 0.61–1.24)
GFR calc Af Amer: 57 mL/min — ABNORMAL LOW (ref >60)
GFR calc non Af Amer: 49 mL/min — ABNORMAL LOW (ref >60)
GLUCOSE: 169 mg/dL — AB (ref 70–99)
POTASSIUM: 6.6 mmol/L — AB (ref 3.5–5.1)
Sodium: 136 mmol/L (ref 135–145)
Total Bilirubin: 10.2 mg/dL — ABNORMAL HIGH (ref 0.3–1.2)
Total Protein: 5.6 g/dL — ABNORMAL LOW (ref 6.5–8.1)

## 2018-02-05 LAB — I-STAT CG4 LACTIC ACID, ED: Lactic Acid, Venous: >17 mmol/L (ref 0.5–1.9)

## 2018-02-05 LAB — PROTIME-INR
INR: 2.78
Prothrombin Time: 29.1 seconds — ABNORMAL HIGH (ref 11.4–15.2)

## 2018-02-05 LAB — ACETAMINOPHEN LEVEL: Acetaminophen (Tylenol), Serum: <10 ug/mL — ABNORMAL LOW (ref 10–30)

## 2018-02-05 MED ORDER — VANCOMYCIN HCL IN DEXTROSE 1-5 GM/200ML-% IV SOLN
1000.0000 mg | Freq: Once | INTRAVENOUS | Status: AC
  Administered 2018-02-05: 1000 mg via INTRAVENOUS
  Filled 2018-02-05: qty 200

## 2018-02-05 MED ORDER — SENNA 8.6 MG PO TABS: 1.0000 | ORAL_TABLET | Freq: Every evening | ORAL | Status: DC | PRN

## 2018-02-05 MED ORDER — ONDANSETRON HCL 4 MG/2ML IJ SOLN: 4.0000 mg | Freq: Four times a day (QID) | INTRAMUSCULAR | Status: DC | PRN

## 2018-02-05 MED ORDER — DIPHENHYDRAMINE HCL 50 MG/ML IJ SOLN: 12.5000 mg | INTRAMUSCULAR | Status: DC | PRN

## 2018-02-05 MED ORDER — PIPERACILLIN-TAZOBACTAM 3.375 G IVPB 30 MIN
3.3750 g | Freq: Once | INTRAVENOUS | Status: AC
  Administered 2018-02-05: 3.375 g via INTRAVENOUS
  Filled 2018-02-05: qty 50

## 2018-02-05 MED ORDER — PROMETHAZINE HCL 25 MG/ML IJ SOLN
12.5000 mg | Freq: Once | INTRAMUSCULAR | Status: AC
  Administered 2018-02-05: 12.5 mg via INTRAVENOUS
  Filled 2018-02-05: qty 1

## 2018-02-05 MED ORDER — TRAZODONE HCL 50 MG PO TABS: 25.0000 mg | ORAL_TABLET | Freq: Every evening | ORAL | Status: DC | PRN

## 2018-02-05 MED ORDER — SODIUM CHLORIDE 0.9 % IV BOLUS (SEPSIS)
1000.0000 mL | Freq: Once | INTRAVENOUS | Status: AC
  Administered 2018-02-05: 1000 mL via INTRAVENOUS

## 2018-02-05 MED ORDER — LORAZEPAM 1 MG PO TABS: 1.0000 mg | ORAL_TABLET | ORAL | Status: DC | PRN

## 2018-02-05 MED ORDER — POLYVINYL ALCOHOL 1.4 % OP SOLN
1.0000 [drp] | Freq: Four times a day (QID) | OPHTHALMIC | Status: DC | PRN
  Filled 2018-02-05: qty 15

## 2018-02-05 MED ORDER — MORPHINE SULFATE (CONCENTRATE) 10 MG/0.5ML PO SOLN: 5.0000 mg | ORAL | Status: DC | PRN

## 2018-02-05 MED ORDER — PANTOPRAZOLE SODIUM 40 MG PO TBEC: 40.0000 mg | DELAYED_RELEASE_TABLET | Freq: Every day | ORAL | Status: DC

## 2018-02-05 MED ORDER — SODIUM BICARBONATE 8.4 % IV SOLN: INTRAVENOUS | Status: DC

## 2018-02-05 MED ORDER — HYDROMORPHONE HCL 1 MG/ML IJ SOLN: 0.5000 mg | INTRAMUSCULAR | Status: DC | PRN

## 2018-02-05 MED ORDER — ONDANSETRON 4 MG PO TBDP: 4.0000 mg | ORAL_TABLET | Freq: Four times a day (QID) | ORAL | Status: DC | PRN

## 2018-02-05 MED ORDER — LORAZEPAM 2 MG/ML PO CONC: 1.0000 mg | ORAL | Status: DC | PRN

## 2018-02-05 MED ORDER — ONDANSETRON HCL 4 MG/2ML IJ SOLN
4.0000 mg | Freq: Once | INTRAMUSCULAR | Status: AC
  Administered 2018-02-05: 4 mg via INTRAVENOUS
  Filled 2018-02-05: qty 2

## 2018-02-05 MED ORDER — LORAZEPAM 2 MG/ML IJ SOLN
1.0000 mg | INTRAMUSCULAR | Status: DC | PRN
  Administered 2018-02-05: 1 mg via INTRAVENOUS
  Filled 2018-02-05: qty 1

## 2018-02-05 MED ORDER — BISACODYL 10 MG RE SUPP: 10.0000 mg | Freq: Every day | RECTAL | Status: DC | PRN

## 2018-02-05 MED ORDER — SODIUM BICARBONATE 8.4 % IV SOLN
INTRAVENOUS | Status: DC
  Administered 2018-02-05: 22:00:00 via INTRAVENOUS
  Filled 2018-02-05 (×2): qty 150

## 2018-02-05 NOTE — ED Notes (Signed)
I gave critical IStat Chem 8 and CG4 results to MD Tyrone Nine

## 2018-02-05 NOTE — Telephone Encounter (Signed)
Tried to call regarding 7/5

## 2018-02-05 NOTE — ED Triage Notes (Addendum)
Pt to ED via [facility] with c/o of global  Pain all over and cant verbalize where the pain is exactly. Pt was hypoglycemic with cbg of 60 on arrival of EMS. EMS gave D10 and CBG went to 170 Pt has cancer with METS to the Liver. Pt is a poor historian and per family his paleness color is his normal. Pt has hx of DM, HTN, Cancer, and GOUT. A& x 4 pt states he became SOB and N/V after lunch and then the pain began as well.

## 2018-02-05 NOTE — ED Provider Notes (Signed)
[place] DEPT Provider Note   CSN: [id number] Arrival date & time: 02/07/2018  2021     History   Chief Complaint Chief Complaint  Patient presents with  . Shortness of Breath  . Nausea  . Abdominal Pain    everywhere    HPI DIONDRE PULIS is a 77 y.o. male.  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  On my evaluation patient states that he has been having chest pain shortness of breath.  He is denying any abdominal pain during my initial evaluation.  He does endorse that he had some episodes of vomiting prior to arrival.  He is a poor historian.  His significant other at bedside states that he was able to take some of his medications this morning including his prednisone however he has not taken most of his other medications.   Past Medical History:  Diagnosis Date  . Anxiety   . Cancer ([place])    melanoma  . Diabetes mellitus ([place])   . Gout   . HTN (hypertension)     Patient Active Problem List   Diagnosis Date Noted  . 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 Metabolic acidosis 84/69/6295  . Hyperkalemia February 28, 2018  . Malnutrition of moderate degree 02/03/2018  . Metastatic melanoma to liver ([place]) 01/31/2018  . Hypoglycemia 01/31/2018  . Elevated LFTs 01/31/2018  . Dehydration 01/31/2018  . Melanoma of skin ([place]) 01/23/2018  . Chest pain at rest 11/23/2016  . HTN (hypertension)   . Gout   . Diabetes mellitus ([place])   . Anxiety   . Stable angina pectoris [facility])     Past Surgical History:  Procedure Laterality Date  . MOLE REMOVAL Right 07/01/2018    melanoma  surgery at [facility] Medications    Prior to Admission medications   Medication Sig Start Date End Date Taking? Authorizing Provider    allopurinol (ZYLOPRIM) 300 MG tablet Take 300 mg by mouth daily.    [provider]  amLODipine (NORVASC) 10 MG tablet Take 10 mg by mouth daily.    [provider]  aspirin EC 81 MG tablet Take 81 mg by mouth daily.    [provider]  colchicine (COLCRYS) 0.6 MG tablet Take 1 tablet (0.6 mg total) by mouth daily as needed (for gout flares). 11/28/16   Reyne Dumas, MD  glimepiride (AMARYL) 2 MG tablet Take 2 mg by mouth daily with breakfast.    [provider]  HYDROcodone-acetaminophen (NORCO/VICODIN) 5-325 MG tablet Take 1-2 tablets by mouth every 4 (four) hours as needed for moderate pain.  01/29/18   [provider]  indapamide (LOZOL) 2.5 MG tablet Take 1 tablet (2.5 mg total) by mouth daily. 11/26/16   Reyne Dumas, MD  LORazepam (ATIVAN) 0.5 MG tablet Take 0.5 mg by mouth daily as needed for anxiety.    [provider]  metFORMIN (GLUCOPHAGE) 1000 MG tablet Take 1,000 mg by mouth 2 (two) times daily with a meal.    [provider]  metoprolol succinate (TOPROL-XL) 50 MG 24 hr tablet Take 50 mg by mouth daily. Take with or immediately following a meal.    [provider]  omeprazole (PRILOSEC) 20 MG capsule Take 20  mg by mouth daily before breakfast.    [provider]  ondansetron (ZOFRAN ODT) 8 MG disintegrating tablet Take 1 tablet (8 mg total) by mouth every 8 (eight) hours as needed for nausea or vomiting. 02/04/18   Amin, Jeanella Flattery, MD  pioglitazone (ACTOS) 30 MG tablet Take 30 mg by mouth daily.    [provider]  predniSONE (DELTASONE) 20 MG tablet Take 6 tablets (120 mg total) by mouth daily with breakfast for 15 days. 02/09/2018 02/20/18  Amin, Jeanella Flattery, MD  sildenafil (REVATIO) 20 MG tablet Take 40-100 mg by mouth daily as needed (erectile dysfunction).     [provider]    Family History Family History  Problem Relation Age of Onset  . Diabetes Mellitus II Mother   . Colon  cancer Father     Social History Social History   Tobacco Use  . Smoking status: Former Research scientist (life sciences)  . Smokeless tobacco: Never Used  . Tobacco comment: Quit 36 years ago  Substance Use Topics  . Alcohol use: Yes    Alcohol/week: 0.6 oz    Types: 1 Cans of beer per week  . Drug use: No     Allergies   Patient has no known allergies.   Review of Systems Review of Systems  Constitutional: Negative for fever.  HENT: Negative for sore throat.   Respiratory: Positive for shortness of breath.   Cardiovascular: Positive for chest pain.  Gastrointestinal: Positive for nausea and vomiting. Negative for abdominal pain.  Genitourinary: Negative for flank pain.  Musculoskeletal: Negative for back pain and neck pain.  Skin: Negative for rash.  Neurological: Negative for headaches.   Physical Exam Updated Vital Signs BP (!) 103/39 (BP Location: Left Arm)   Pulse (!) 113   Temp (!) 95.3 F (35.2 C) (Rectal)   Resp (!) 26   Ht '5\' 6"'$  (1.676 m)   Wt 61.2 kg (135 lb)   SpO2 97%   BMI 21.79 kg/m   Physical Exam  Constitutional:  Appears chronically ill, pale and jaundiced  HENT:  Head: Normocephalic and atraumatic.  Eyes: Pupils are equal, round, and reactive to light. Conjunctivae are normal.  Scleral icterus  Neck: Neck supple.  Cardiovascular: Normal rate, regular rhythm and normal heart sounds.  No murmur heard. Pulses:      Carotid pulses are on the right side with bruit. DP pulses are weak bilaterally  Pulmonary/Chest: Effort normal. No respiratory distress. He has decreased breath sounds. He has no wheezes. He has no rhonchi. He has no rales.  Tachypneic, satting at 100% on 3L, speaking in full sentences.   Abdominal: Soft. Bowel sounds are normal.  RUQ TTP, no guarding  Musculoskeletal:  1-2+ pitting edema to BLE  Neurological: He is alert.  Moving all extremities and following simple commands  Skin: Skin is warm and dry.  Delayed cap refill  Psychiatric: He has a  normal mood and affect.  Nursing note and vitals reviewed.  ED Treatments / Results  Labs (all labs ordered are listed, but only abnormal results are displayed) Labs Reviewed  COMPREHENSIVE METABOLIC PANEL - Abnormal; Notable for the following components:      Result Value   Potassium 6.6 (*)    Chloride 93 (*)    CO2 <7 (*)    Glucose, Bld 169 (*)    BUN 58 (*)    Creatinine, Ser 1.37 (*)    Total Protein 5.6 (*)    Albumin 2.6 (*)  AST 1,305 (*)    ALT 498 (*)    Alkaline Phosphatase 757 (*)    Total Bilirubin 10.2 (*)    GFR calc non Af Amer 49 (*)    GFR calc Af Amer 57 (*)    All other components within normal limits  CBC WITH DIFFERENTIAL/PLATELET - Abnormal; Notable for the following components:   WBC 26.5 (*)    MCHC 29.7 (*)    RDW 17.8 (*)    Neutro Abs 21.9 (*)    Monocytes Absolute 1.9 (*)    Basophils Absolute 0.3 (*)    All other components within normal limits  PROTIME-INR - Abnormal; Notable for the following components:   Prothrombin Time 29.1 (*)    All other components within normal limits  ACETAMINOPHEN LEVEL - Abnormal; Notable for the following components:   Acetaminophen (Tylenol), Serum <10 (*)    All other components within normal limits  BLOOD GAS, VENOUS - Abnormal; Notable for the following components:   pH, Ven 6.884 (*)    pO2, Ven 92.1 (*)    All other components within normal limits  I-STAT CG4 LACTIC ACID, ED - Abnormal; Notable for the following components:   Lactic Acid, Venous >17.00 (*)    All other components within normal limits  I-STAT CHEM 8, ED - Abnormal; Notable for the following components:   Sodium 132 (*)    Potassium 6.1 (*)    BUN 58 (*)    Creatinine, Ser 1.40 (*)    Glucose, Bld 163 (*)    TCO2 6 (*)    Hemoglobin 18.0 (*)    HCT 53.0 (*)    All other components within normal limits  CULTURE, BLOOD (ROUTINE X 2)  CULTURE, BLOOD (ROUTINE X 2)  URINE CULTURE  TSH  URINALYSIS, ROUTINE W REFLEX MICROSCOPIC    T4  I-STAT TROPONIN, ED  I-STAT CG4 LACTIC ACID, ED    EKG EKG Interpretation  Date/Time:  Wednesday February 05 2018 20:40:14 EDT Ventricular Rate:  108 PR Interval:    QRS Duration: 99 QT Interval:  323 QTC Calculation: 433 R Axis:   90 Text Interpretation:  Sinus tachycardia Borderline right axis deviation RSR' in V1 or V2, probably normal variant ST elevation, consider inferior injury No significant change since last tracing Confirmed by Deno Etienne [phone]) on 02/16/2018 9:36:34 PM   Radiology Dg Chest Portable 1 View  Result Date: 02/13/2018 CLINICAL DATA:  77 y/o  M; shortness of breath. EXAM: PORTABLE CHEST 1 VIEW COMPARISON:  11/22/2016 chest radiograph. FINDINGS: Stable normal cardiac silhouette given projection and technique. Low lung volumes accentuate pulmonary markings. No focal consolidation. No pleural effusion or pneumothorax. No acute osseous abnormality is evident. IMPRESSION: Low lung volumes.  No acute pulmonary process identified. Electronically Signed   By: Kristine Garbe M.D.   On: 02/20/2018 22:52    Procedures Procedures (including critical care time) CRITICAL CARE Performed by: Rodney Booze   Total critical care time: 38 minutes  Critical care time was exclusive of separately billable procedures and treating other patients.  Critical care was necessary to treat or prevent imminent or life-threatening deterioration.  Critical care was time spent personally by me on the following activities: development of treatment plan with patient and/or surrogate as well as nursing, discussions with consultants, evaluation of patient's response to treatment, examination of patient, obtaining history from patient or surrogate, ordering and performing treatments and interventions, ordering and review of laboratory studies, ordering and review of radiographic  studies, pulse oximetry and re-evaluation of patient's condition.   Medications Ordered in  ED Medications  sodium bicarbonate 150 mEq in dextrose 5 % 1,000 mL infusion ( Intravenous New Bag/Given 02/13/2018 2215)  traZODone (DESYREL) tablet 25 mg (has no administration in time range)  morphine CONCENTRATE 10 MG/0.5ML oral solution 5 mg (has no administration in time range)    Or  morphine CONCENTRATE 10 MG/0.5ML oral solution 5 mg (has no administration in time range)  HYDROmorphone (DILAUDID) injection 0.5 mg (has no administration in time range)  LORazepam (ATIVAN) tablet 1 mg ( Oral See Alternative 02/24/2018 2359)    Or  LORazepam (ATIVAN) 2 MG/ML concentrated solution 1 mg ( Sublingual See Alternative 02/19/2018 2359)    Or  LORazepam (ATIVAN) injection 1 mg (1 mg Intravenous Given 02/16/2018 2359)  diphenhydrAMINE (BENADRYL) injection 12.5 mg (has no administration in time range)  senna (SENOKOT) tablet 8.6 mg (has no administration in time range)  bisacodyl (DULCOLAX) suppository 10 mg (has no administration in time range)  ondansetron (ZOFRAN-ODT) disintegrating tablet 4 mg (has no administration in time range)    Or  ondansetron (ZOFRAN) injection 4 mg (has no administration in time range)  pantoprazole (PROTONIX) EC tablet 40 mg (has no administration in time range)  polyvinyl alcohol (LIQUIFILM TEARS) 1.4 % ophthalmic solution 1 drop (has no administration in time range)  sodium chloride 0.9 % bolus 1,000 mL (0 mLs Intravenous Stopped 03/04/2018 2222)    And  sodium chloride 0.9 % bolus 1,000 mL (0 mLs Intravenous Stopped 02/22/2018 2256)  piperacillin-tazobactam (ZOSYN) IVPB 3.375 g (0 g Intravenous Stopped 02/10/2018 2159)  vancomycin (VANCOCIN) IVPB 1000 mg/200 mL premix (0 mg Intravenous Stopped 02/04/2018 2241)  ondansetron (ZOFRAN) injection 4 mg (4 mg Intravenous Given 02/24/2018 2140)  promethazine (PHENERGAN) injection 12.5 mg (12.5 mg Intravenous Given 03/04/2018 2301)     Initial Impression / Assessment and Plan / ED Course  I have reviewed the triage vital signs and the nursing  notes.  Pertinent labs & imaging results that were available during my care of the patient were reviewed by me and considered in my medical decision making (see chart for details).  Discussed pt presentation and exam findings with Dr. Tyrone Nine, who urgently evaluated the patient and assisted with workup and treatment of the patient.  He had a goals of care discussion with the patient and family and the family informed him that the patient is a DNR.  He also discussed consulting palliative care making the patient comfortable as he is is unlikely to recover from his current disease process.  They agree with the plan is for palliative care admission to the hospital for comfort measures.  Re-evaluated pt. He is still tachycardic and tachypneic. He is c/o pain to his chest. Bear hugger in place.   11:33 PM CONSULT with Dr. Loletha Carrow with gastroenterology.  He states that patient's laboratory work is concerning for acute hepatic failure and that there are likely no interventions that could improve his liver function or improve his disease process at this time. He states that the proposed plan for palliative care is likely the patient's best option.  11:50 PM CONSULT with Dr. Roel Cluck who will admit the patient to the hospitalist service for comfort care.    Final Clinical Impressions(s) / ED Diagnoses   Final diagnoses:  Acute liver failure without hepatic coma   Patient presented to the emergency department with multiple complaints.  He is initially tachycardic and hypertensive but is also tachypneic  and requiring 3 L O2 to maintain sats at 95 to 100%.  His rectal temperature was 95.3.  After initial exam sepsis protocol was initiated, IV fluids and IV antibiotics were given.  Chest x-ray was negative for pneumonia.  His EKG showed sinus tach with no significant change since last tracing.  His labs has multiple abnormalities and were significant for CBC with WBC 26.5. CMP with hyperkalemia of 6.6, BUN 58, Cr  1.37. His AST and ALT are grossly elevated from labs drawn yesterday. AST elevated to 1305 from 530, and ALT elevated to 498 from 254, Alk phos elevated to 757 from 663, and total bili increased to 10.2 from 5.6 one day ago. PT and INR are also worsening from one day ago. Lactic acid is elevated to >17. VBG showed severe acidosis to 6.884.   Given his worsening liver enzymes, alk phos, total bilirubin, PT and INR gastroenterology was consulted for their recommendations as above.  Patient is likely in hepatic failure there and there are no interventions that would improve his outcome.  Care plan was discussed with the family who would like the patient to be made a DNR and be placed on palliative care.  Patient admitted to the hospitalist service for further care.  ED Discharge Orders    None       Rodney Booze, PA-C 02-19-2018 0014    Deno Etienne, DO 02/08/18 0246

## 2018-02-05 NOTE — Progress Notes (Signed)
A consult was received from an ED physician for Vancomycin & Zosyn per pharmacy dosing.  The patient's profile has been reviewed for ht/wt/allergies/indication/available labs.   A one time order has been placed for Vancomycin 1gm & Zosyn 3.375gm.  Further antibiotics/pharmacy consults should be ordered by admitting physician if indicated.                       Thank you,  Minda Ditto 03/04/2018  9:07 PM

## 2018-02-05 NOTE — ED Notes (Signed)
Bed: YF74 Expected date:  Expected time:  Means of arrival:  Comments: 61m weakness

## 2018-02-05 NOTE — H&P (Signed)
Bryan Lloyd XTA:[id number] DOB: 05/04/1941 DOA: 02/08/2018     PCP: [name], MD   Outpatient Specialists:      Oncology  Dr.Shadad Ihor Austin MD (Attending) [phone] (Work) [phone] [facility]) [name], [place] [address]   Patient arrived to ER on 02/03/2018 at 2021  Patient coming from:   home Lives alone,       Chief Complaint:  Chief Complaint  Patient presents with  . Shortness of Breath  . Nausea  . Abdominal Pain    everywhere    HPI: Bryan Lloyd is a 77 y.o. 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 in take but has had some of his medications today such as prednisone. 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 Presented today to emergency department and was found to have worsening liver failure Per discussion with GI fulminant hepatic failure per patient and family at this point she was complete comfort care and palliative care consult  Regarding pertinent Chronic problems: History of diabetes on Amaryl, metformin, Actos History of hypertension on Norvasc and metoprolol  Regarding history of melanomaMelanoma initially diagnosed in October 2018 status post resection at [facility] in March 2019 noted to have biopsy of right cervical node positive for melanoma.  Started on neoadjuvant nivolumabPatient is status post 5 infusions of nivolumab.  Since May started to feel fatigued.  In June  had his nodes resected.  He continues to have progressive generalized fatigue and darkened urine although no jaundice. 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 On June 24's found to have total bili of 4.1 alk phos 566 AST 390 ALT 209. 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    Oncology recommended starting on prednisone.  While in ER:  Lab work worrisome for elevated lactic acid evidence of metabolic acidosis Following Medications were ordered in ER: Medications  sodium bicarbonate 150 mEq in dextrose 5 % 1,000 mL infusion ( Intravenous New Bag/Given 02/25/2018 2215)  traZODone (DESYREL) tablet 25 mg (has no administration in time range)  morphine CONCENTRATE 10 MG/0.5ML oral solution 5 mg (has no administration in time range)    Or  morphine CONCENTRATE 10 MG/0.5ML oral solution 5 mg (has no administration in time range)  HYDROmorphone (DILAUDID) injection 0.5 mg (has no administration in time range)  LORazepam (ATIVAN) tablet 1 mg (has no administration in time range)    Or  LORazepam (ATIVAN) 2 MG/ML concentrated solution 1 mg (has no administration in time range)    Or  LORazepam (ATIVAN) injection 1 mg (has no administration in time range)  diphenhydrAMINE (BENADRYL) injection 12.5 mg (has no administration in time range)  senna (SENOKOT) tablet 8.6 mg (has no administration in time range)  bisacodyl (DULCOLAX) suppository 10 mg (has no administration in time range)  ondansetron (  ZOFRAN-ODT) disintegrating tablet 4 mg (has no administration in time range)    Or  ondansetron (ZOFRAN) injection 4 mg (has no administration in time range)  pantoprazole (PROTONIX) EC tablet 40 mg (has no administration in time range)  polyvinyl alcohol (LIQUIFILM TEARS) 1.4 % ophthalmic solution 1 drop (has no  administration in time range)  sodium chloride 0.9 % bolus 1,000 mL (0 mLs Intravenous Stopped 02/26/2018 2222)    And  sodium chloride 0.9 % bolus 1,000 mL (0 mLs Intravenous Stopped 02/24/2018 2256)  piperacillin-tazobactam (ZOSYN) IVPB 3.375 g (0 g Intravenous Stopped 02/21/2018 2159)  vancomycin (VANCOCIN) IVPB 1000 mg/200 mL premix (0 mg Intravenous Stopped 02/25/2018 2241)  ondansetron (ZOFRAN) injection 4 mg (4 mg Intravenous Given 02/16/2018 2140)  promethazine (PHENERGAN) injection 12.5 mg (12.5 mg Intravenous Given 02/27/2018 2301)    Significant initial  Findings: Abnormal Labs Reviewed  COMPREHENSIVE METABOLIC PANEL - Abnormal; Notable for the following components:      Result Value   Potassium 6.6 (*)    Chloride 93 (*)    CO2 <7 (*)    Glucose, Bld 169 (*)    BUN 58 (*)    Creatinine, Ser 1.37 (*)    Total Protein 5.6 (*)    Albumin 2.6 (*)    AST 1,305 (*)    ALT 498 (*)    Alkaline Phosphatase 757 (*)    Total Bilirubin 10.2 (*)    GFR calc non Af Amer 49 (*)    GFR calc Af Amer 57 (*)    All other components within normal limits  PROTIME-INR - Abnormal; Notable for the following components:   Prothrombin Time 29.1 (*)    All other components within normal limits  ACETAMINOPHEN LEVEL - Abnormal; Notable for the following components:   Acetaminophen (Tylenol), Serum <10 (*)    All other components within normal limits  BLOOD GAS, VENOUS - Abnormal; Notable for the following components:   pH, Ven 6.884 (*)    pO2, Ven 92.1 (*)    All other components within normal limits  I-STAT CG4 LACTIC ACID, ED - Abnormal; Notable for the following components:   Lactic Acid, Venous >17.00 (*)    All other components within normal limits  I-STAT CHEM 8, ED - Abnormal; Notable for the following components:   Sodium 132 (*)    Potassium 6.1 (*)    BUN 58 (*)    Creatinine, Ser 1.40 (*)    Glucose, Bld 163 (*)    TCO2 6 (*)    Hemoglobin 18.0 (*)    HCT 53.0 (*)    All other components within  normal limits       Cr  Up from baseline see below Lab Results  Component Value Date   CREATININE 1.37 (H) 03/03/2018   CREATININE 1.40 (H) 03/03/2018   CREATININE 0.98 02/04/2018      WBC 26.5  HG/HCT  Up from baseline see below    Component Value Date/Time   HGB 18.0 (H) 02/03/2018 2124   HGB 14.9 01/31/2018 1318   HCT 53.0 (H) 03/03/2018 2124       Troponin (Point of Care Test) Recent Labs    02/19/2018 2123  TROPIPOC 0.01   INR 2.78  VBG 6.884/CO2 undetectable/ O2 92.1   Lactic Acid, Venous    Component Value Date/Time   LATICACIDVEN >17.00 (HH) 02/21/2018 2125   TSH 0.861  cute  CXR -  NON acute   ECG:  Personally reviewed by  me showing: HR : 108 Rhythm: sinus tachycardia   no evidence of ischemic changes QTC 433       ED Triage Vitals  Enc Vitals Group     BP 02/14/2018 2039 (!) 155/75     Pulse Rate 02/11/2018 2115 (!) 103     Resp 02/27/2018 2039 (!) 31     Temp 02/17/2018 2045 (!) 95.3 F (35.2 C)     Temp Source 02/26/2018 2039 Rectal     SpO2 02/04/2018 2029 98 %     Weight 02/10/2018 2041 135 lb (61.2 kg)     Height 02/03/2018 2041 _0  (1.676 m)     Head Circumference --      Peak Flow --      Pain Score 02/24/2018 2040 10     Pain Loc --      Pain Edu? --      Excl. in [place]? --   TMAX(24)@       Latest  Blood pressure 136/62, pulse (!) 105, temperature (!) 95.3 F (35.2 C), temperature source Rectal, resp. rate (!) 25, height _1  (1.676 m), weight 61.2 kg (135 lb), SpO2 95 %.      ER Provider Called:   Gi  Dr.Danis  They Recommend palliative care consult    Hospitalist was called for admission for fulminant hepatic failure   Review of Systems:    Pertinent positives include: fatigue, weight loss abdominal pain, nausea, vomiting,   Constitutional:  No weight loss, night sweats, Fevers, chills, HEENT:  No headaches, Difficulty swallowing,Tooth/dental problems,Sore throat,  No sneezing, itching, ear ache, nasal congestion,  post nasal drip,  Cardio-vascular:  No chest pain, Orthopnea, PND, anasarca, dizziness, palpitations.no Bilateral lower extremity swelling  GI:  No heartburn, indigestion, diarrhea, change in bowel habits, loss of appetite, melena, blood in stool, hematemesis Resp:  no shortness of breath at rest. No dyspnea on exertion, No excess mucus, no productive cough, No non-productive cough, No coughing up of blood.No change in color of mucus.No wheezing. Skin:  no rash or lesions. No jaundice GU:  no dysuria, change in color of urine, no urgency or frequency. No straining to urinate.  No flank pain.  Musculoskeletal:  No joint pain or no joint swelling. No decreased range of motion. No back pain.  Psych:  No change in mood or affect. No depression or anxiety. No memory loss.  Neuro: no localizing neurological complaints, no tingling, no weakness, no double vision, no gait abnormality, no slurred speech, no confusion  As per HPI otherwise 10 point review of systems negative.   Past Medical History:   Past Medical History:  Diagnosis Date  . Anxiety   . Cancer ([place])    melanoma  . Diabetes mellitus ([place])   . Gout   . HTN (hypertension)       Past Surgical History:  Procedure Laterality Date  . MOLE REMOVAL Right 07/01/2018    melanoma  [facility]  . NO PAST SURGERIES      Social History:  Ambulatory bed bound     reports that he has quit smoking. He has never used smokeless tobacco. He reports that he drinks about 0.6 oz of alcohol per week. He reports that he does not use drugs.     Family History:  Family History  Problem Relation Age of Onset  . Diabetes Mellitus II Mother   . Colon cancer Father     Allergies: No Known Allergies   Prior  to Admission medications   Medication Sig Start Date End Date Taking? Authorizing Provider  allopurinol (ZYLOPRIM) 300 MG tablet Take 300 mg by mouth daily.    [provider]  amLODipine (NORVASC) 10 MG  tablet Take 10 mg by mouth daily.    [provider]  aspirin EC 81 MG tablet Take 81 mg by mouth daily.    [provider]  colchicine (COLCRYS) 0.6 MG tablet Take 1 tablet (0.6 mg total) by mouth daily as needed (for gout flares). 11/28/16   Reyne Dumas, MD  glimepiride (AMARYL) 2 MG tablet Take 2 mg by mouth daily with breakfast.    [provider]  HYDROcodone-acetaminophen (NORCO/VICODIN) 5-325 MG tablet Take 1-2 tablets by mouth every 4 (four) hours as needed for moderate pain.  01/29/18   [provider]  indapamide (LOZOL) 2.5 MG tablet Take 1 tablet (2.5 mg total) by mouth daily. 11/26/16   Reyne Dumas, MD  LORazepam (ATIVAN) 0.5 MG tablet Take 0.5 mg by mouth daily as needed for anxiety.    [provider]  metFORMIN (GLUCOPHAGE) 1000 MG tablet Take 1,000 mg by mouth 2 (two) times daily with a meal.    [provider]  metoprolol succinate (TOPROL-XL) 50 MG 24 hr tablet Take 50 mg by mouth daily. Take with or immediately following a meal.    [provider]  omeprazole (PRILOSEC) 20 MG capsule Take 20 mg by mouth daily before breakfast.    [provider]  ondansetron (ZOFRAN ODT) 8 MG disintegrating tablet Take 1 tablet (8 mg total) by mouth every 8 (eight) hours as needed for nausea or vomiting. 02/04/18   Amin, Jeanella Flattery, MD  pioglitazone (ACTOS) 30 MG tablet Take 30 mg by mouth daily.    [provider]  predniSONE (DELTASONE) 20 MG tablet Take 6 tablets (120 mg total) by mouth daily with breakfast for 15 days. 02/20/2018 02/20/18  Amin, Jeanella Flattery, MD  sildenafil (REVATIO) 20 MG tablet Take 40-100 mg by mouth daily as needed (erectile dysfunction).     [provider]   Physical Exam: Blood pressure 136/62, pulse (!) 105, temperature (!) 95.3 F (35.2 C), temperature source Rectal, resp. rate (!) 25, height _0  (1.676 m), weight 61.2 kg (135 lb), SpO2 95 %. 1. General:  in No Acute distress   toxic acutely ill -appearing 2. Psychological: Alert and  Oriented to self 3. Head/ENT:     Dry Mucous Membranes                          Head Non traumatic, neck supple                         Poor Dentition 4. SKIN:   decreased Skin turgor,  Skin clean Dry and intact no rash 5. Heart: Regular rate and rhythm no  Murmur, no Rub or gallop 6. Lungs: Clear to auscultation bilaterally, no wheezes or crackles   7. Abdomen: Soft, RUQ tender, Non distended bowel sounds present 8. Lower extremities: no clubbing, cyanosis, or edema 9. Neurologically Grossly intact, moving all 4 extremities equally  10. MSK: Normal range of motion   LABS:     Recent Labs  Lab 01/31/18 1318 02/01/18 0334 02/02/18 0759 02/03/18 0434 02/04/18 0420 02/09/2018 2124  WBC 13.1* 10.9* 13.0* 17.6* 17.5*  --   NEUTROABS 10.5*  --   --   --   --   --  HGB 14.9 13.0 13.9 14.3 14.2 18.0*  HCT 44.7 38.3* 40.7 43.2 42.2 53.0*  MCV 85.0 83.4 84.3 83.4 84.1  --   PLT 183 161 180 197 171  --    Basic Metabolic Panel: Recent Labs  Lab 02/01/18 0334 02/02/18 0759 02/03/18 0434 02/04/18 0420 02/24/2018 2124 02/24/2018 2128  NA 133* 132* 133* 135 132* 136  K 4.3 4.6 4.7 5.1 6.1* 6.6*  CL 95* 95* 96* 97* 100 93*  CO2 21* 21* 20* 18*  --  <7*  GLUCOSE 68* 121* 120* 93 163* 169*  BUN 26* 23 27* 34* 58* 58*  CREATININE 1.12 0.96 0.90 0.98 1.40* 1.37*  CALCIUM 8.7* 9.1 9.7 9.6  --  10.3  MG 1.6* 1.8 2.0 2.1  --   --   PHOS 2.4*  --   --   --   --   --       Recent Labs  Lab 02/01/18 0334 02/02/18 0759 02/03/18 0434 02/04/18 0420 02/25/2018 2128  AST 271* 274* 427* 530* 1,305*  ALT 144* 148* 198* 254* 498*  ALKPHOS 397* 535* 714* 663* 757*  BILITOT 3.5* 3.9* 5.0* 5.6* 10.2*  PROT 5.0* 5.3* 5.3* 4.9* 5.6*  ALBUMIN 2.5* 2.5* 2.6* 2.3* 2.6*   Recent Labs  Lab 01/31/18 1759  LIPASE 28   No results for input(s): AMMONIA in the last 168 hours.    HbA1C: No results for input(s): HGBA1C in the last 72  hours. CBG: Recent Labs  Lab 02/03/18 2011 02/04/18 0010 02/04/18 0414 02/04/18 0730 02/04/18 1132  GLUCAP 148* 150* 95 86 147*      Urine analysis:    Component Value Date/Time   COLORURINE AMBER (A) 01/31/2018 1759   APPEARANCEUR CLEAR 01/31/2018 1759   LABSPEC 1.045 (H) 01/31/2018 1759   PHURINE 5.0 01/31/2018 1759   GLUCOSEU NEGATIVE 01/31/2018 1759   HGBUR SMALL (A) 01/31/2018 1759   BILIRUBINUR MODERATE (A) 01/31/2018 1759   KETONESUR 20 (A) 01/31/2018 1759   PROTEINUR NEGATIVE 01/31/2018 1759   NITRITE NEGATIVE 01/31/2018 1759   LEUKOCYTESUR NEGATIVE 01/31/2018 1759       Cultures: No results found for: SDES, SPECREQUEST, CULT, REPTSTATUS   Radiological Exams on Admission: Dg Chest Portable 1 View  Result Date: 02/18/2018 CLINICAL DATA:  77 y/o  M; shortness of breath. EXAM: PORTABLE CHEST 1 VIEW COMPARISON:  11/22/2016 chest radiograph. FINDINGS: Stable normal cardiac silhouette given projection and technique. Low lung volumes accentuate pulmonary markings. No focal consolidation. No pleural effusion or pneumothorax. No acute osseous abnormality is evident. IMPRESSION: Low lung volumes.  No acute pulmonary process identified. Electronically Signed   By: Kristine Garbe M.D.   On: 02/10/2018 22:52    Chart has been reviewed    Assessment/Plan  77 y.o. 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  Agree with GI currently patient would benefit most from palliative care consult as he is actively passing  . HTN (hypertension) -comfort care hold off on home medications . Hypoglycemia -has improved with D10 administration currently comfort care will start monitoring CBGs to avoid  unnecessary sticks . Metastatic melanoma to liver [facility]) -will notify oncology the patient has been readmitted  . Metabolic acidosis -likely secondary to fulminant hepatic failure explains elevated  respiratory rate.  Provide comfort  measures . Hyperkalemia -provide comfort measures    Other plan as per orders.  DVT prophylaxis:  SCD    Code Status:    DNR/DNI and comfort care as per  family  I had personally discussed CODE STATUS with   Family     Family Communication:   Family  at  Bedside  plan of care was discussed with Daughter,    Disposition Plan: May need beacon placement                     Palliative care   consulted                          Consults called: Gi aware, will email hematology   Admission status:    inpatient     Level of care    medical floor            Toy Baker 02-24-2018, 1:23 AM    [facility]  Pager [phone]   after 2 AM please page floor coverage PA If 7AM-7PM, please contact the day team taking care of the patient  [email or web address]  Password [email or web address]

## 2018-02-05 NOTE — ED Notes (Signed)
Two RN have been at the bedside with this pt.  He continues to be restless, he reports nausea and extreme thirst.  2 IV placed (one in [place] and one in [place].  PT also has a third IV that was placed PTA by EMS in [place].  Blood including BC x2 were drawn, VBG was drawn and RT came to get this.  Warming blanket applied after pt was repositioned.  Pt continues to thrash and states that he feels terrible, ice chips given for pt comfort.

## 2018-02-05 NOTE — ED Notes (Signed)
Xray at the bedside.

## 2018-02-06 ENCOUNTER — Encounter (HOSPITAL_COMMUNITY): Payer: Self-pay | Admitting: Internal Medicine

## 2018-02-06 DIAGNOSIS — E872 Acidosis, unspecified: Secondary | ICD-10-CM | POA: Diagnosis present

## 2018-02-06 DIAGNOSIS — K7200 Acute and subacute hepatic failure without coma: Secondary | ICD-10-CM | POA: Diagnosis present

## 2018-02-06 DIAGNOSIS — I10 Essential (primary) hypertension: Secondary | ICD-10-CM | POA: Diagnosis present

## 2018-02-06 DIAGNOSIS — E11649 Type 2 diabetes mellitus with hypoglycemia without coma: Secondary | ICD-10-CM | POA: Diagnosis present

## 2018-02-06 DIAGNOSIS — M109 Gout, unspecified: Secondary | ICD-10-CM | POA: Diagnosis present

## 2018-02-06 DIAGNOSIS — Z833 Family history of diabetes mellitus: Secondary | ICD-10-CM | POA: Diagnosis not present

## 2018-02-06 DIAGNOSIS — E875 Hyperkalemia: Secondary | ICD-10-CM | POA: Diagnosis present

## 2018-02-06 DIAGNOSIS — E860 Dehydration: Secondary | ICD-10-CM

## 2018-02-06 DIAGNOSIS — Z515 Encounter for palliative care: Secondary | ICD-10-CM | POA: Diagnosis present

## 2018-02-06 DIAGNOSIS — C787 Secondary malignant neoplasm of liver and intrahepatic bile duct: Secondary | ICD-10-CM | POA: Diagnosis present

## 2018-02-06 DIAGNOSIS — R0602 Shortness of breath: Secondary | ICD-10-CM | POA: Diagnosis present

## 2018-02-06 DIAGNOSIS — K7290 Hepatic failure, unspecified without coma: Secondary | ICD-10-CM | POA: Diagnosis present

## 2018-02-06 DIAGNOSIS — Z66 Do not resuscitate: Secondary | ICD-10-CM | POA: Diagnosis present

## 2018-02-06 DIAGNOSIS — Z7984 Long term (current) use of oral hypoglycemic drugs: Secondary | ICD-10-CM | POA: Diagnosis not present

## 2018-02-06 DIAGNOSIS — C439 Malignant melanoma of skin, unspecified: Secondary | ICD-10-CM | POA: Diagnosis present

## 2018-02-06 DIAGNOSIS — Z79899 Other long term (current) drug therapy: Secondary | ICD-10-CM | POA: Diagnosis not present

## 2018-02-06 DIAGNOSIS — Z7401 Bed confinement status: Secondary | ICD-10-CM | POA: Diagnosis not present

## 2018-02-06 DIAGNOSIS — Z7982 Long term (current) use of aspirin: Secondary | ICD-10-CM | POA: Diagnosis not present

## 2018-02-06 DIAGNOSIS — Z87891 Personal history of nicotine dependence: Secondary | ICD-10-CM | POA: Diagnosis not present

## 2018-02-06 MED ORDER — BIOTENE DRY MOUTH MT LIQD: 15.0000 mL | OROMUCOSAL | Status: DC | PRN

## 2018-02-06 MED ORDER — SODIUM CHLORIDE 0.9 % IV SOLN: 250.0000 mL | INTRAVENOUS | Status: DC | PRN

## 2018-02-06 MED ORDER — GLYCOPYRROLATE 0.2 MG/ML IJ SOLN: 0.2000 mg | INTRAMUSCULAR | Status: DC | PRN

## 2018-02-06 MED ORDER — GLYCOPYRROLATE 1 MG PO TABS: 1.0000 mg | ORAL_TABLET | ORAL | Status: DC | PRN

## 2018-02-06 MED ORDER — SODIUM CHLORIDE 0.9% FLUSH: 3.0000 mL | INTRAVENOUS | Status: DC | PRN

## 2018-02-06 MED ORDER — ALBUTEROL SULFATE (2.5 MG/3ML) 0.083% IN NEBU: 2.5000 mg | INHALATION_SOLUTION | RESPIRATORY_TRACT | Status: DC | PRN

## 2018-02-06 MED ORDER — SODIUM CHLORIDE 0.9% FLUSH
3.0000 mL | Freq: Two times a day (BID) | INTRAVENOUS | Status: DC
  Administered 2018-02-06: 3 mL via INTRAVENOUS

## 2018-02-07 ENCOUNTER — Inpatient Hospital Stay: Payer: Medicare Other | Admitting: Oncology

## 2018-02-07 ENCOUNTER — Inpatient Hospital Stay: Payer: Medicare Other

## 2018-02-07 ENCOUNTER — Other Ambulatory Visit: Payer: Medicare Other

## 2018-02-07 ENCOUNTER — Ambulatory Visit: Payer: Medicare Other | Admitting: Oncology

## 2018-02-07 LAB — T4: T4 TOTAL: 4.2 ug/dL — AB (ref 4.5–12.0)

## 2018-02-10 LAB — CULTURE, BLOOD (ROUTINE X 2)
Culture: NO GROWTH
Culture: NO GROWTH
Special Requests: ADEQUATE
Special Requests: ADEQUATE

## 2018-02-11 ENCOUNTER — Inpatient Hospital Stay: Payer: Medicare Other | Admitting: Oncology

## 2018-02-11 ENCOUNTER — Inpatient Hospital Stay: Payer: Medicare Other

## 2018-02-26 ENCOUNTER — Ambulatory Visit: Payer: Medicare Other

## 2018-02-26 ENCOUNTER — Other Ambulatory Visit: Payer: Medicare Other

## 2018-02-26 ENCOUNTER — Ambulatory Visit: Payer: Medicare Other | Admitting: Oncology

## 2018-03-06 NOTE — ED Notes (Signed)
Attempted to call report, RN in middle of giving report and had to call me back

## 2018-03-06 NOTE — ED Notes (Signed)
ED TO INPATIENT HANDOFF REPORT  Name/Age/Gender Bryan Lloyd 77 y.o. male  Code Status    Code Status Orders  (From admission, onward)        Start     Ordered   03/02/2018 2325  Do not attempt resuscitation (DNR)  Continuous    Question Answer Comment  In the event of cardiac or respiratory ARREST Do not call a "code blue"   In the event of cardiac or respiratory ARREST Do not perform Intubation, CPR, defibrillation or ACLS   In the event of cardiac or respiratory ARREST Use medication by any route, position, wound care, and other measures to relive pain and suffering. May use oxygen, suction and manual treatment of airway obstruction as needed for comfort.   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 Anxiety   . Cancer ([place])    melanoma  . Diabetes mellitus ([place])   . Gout   . HTN (hypertension)     Allergies No Known Allergies  IV Location/Drains/Wounds Patient Lines/Drains/Airways Status   Active Line/Drains/Airways    Name:    Placement date:   Placement time:   Site:   Days:   Peripheral IV 02/12/2018 Left Forearm   02/12/2018    2030    Forearm   1   Peripheral IV 02/04/2018 Right Antecubital   02/18/2018    2109    Antecubital   1   Peripheral IV 02/27/2018 Left Antecubital   02/15/2018    2145    Antecubital   1          Labs/Imaging Results for orders placed or performed during the hospital encounter of 02/12/2018 (from the past 48 hour(s))  I-stat troponin, ED     Status: None   Collection Time: 02/16/2018  9:23 PM  Result Value Ref Range   Troponin i, poc 0.01 0.00 - 0.08 ng/mL   Comment 3            Comment: Due to the release kinetics of cTnI, a negative result within the first hours of the onset of symptoms does not rule out myocardial infarction with certainty. If myocardial infarction is still suspected, repeat the test at appropriate intervals.   I-Stat Chem 8, ED     Status: Abnormal   Collection Time: 02/14/2018  9:24 PM  Result Value Ref Range   Sodium 132 (L) 135 - 145 mmol/L   Potassium 6.1 (H) 3.5 - 5.1 mmol/L   Chloride 100 98 -  111 mmol/L   BUN 58 (H) 8 - 23 mg/dL   Creatinine, Ser 1.40 (H) 0.61 - 1.24 mg/dL   Glucose, Bld 163 (H) 70 - 99 mg/dL   Calcium, Ion 1.15 1.15 - 1.40 mmol/L   TCO2 6 (L) 22 - 32 mmol/L   Hemoglobin 18.0 (H) 13.0 - 17.0 g/dL   HCT 53.0 (H) 39.0 - 52.0 %   Comment NOTIFIED PHYSICIAN   I-Stat CG4 Lactic Acid, ED  (not at  [facility])     Status: Abnormal   Collection Time: 02/16/2018  9:25 PM  Result Value Ref Range   Lactic Acid, Venous >17.00 (HH) 0.5 - 1.9 mmol/L   Comment NOTIFIED PHYSICIAN   Comprehensive metabolic panel     Status: Abnormal   Collection Time: 03/02/2018  9:28 PM  Result Value Ref Range   Sodium 136 135 - 145 mmol/L   Potassium 6.6 (HH) 3.5 - 5.1 mmol/L    Comment: NO VISIBLE HEMOLYSIS CRITICAL RESULT CALLED TO, READ BACK BY AND VERIFIED WITH: L COLES RN 2250 02/08/2018 A NAVARRO    Chloride 93 (L) 98 - 111 mmol/L    Comment: Please note change in  reference range.   CO2 <7 (L) 22 - 32 mmol/L   Glucose, Bld 169 (H) 70 - 99 mg/dL    Comment: Please note change in reference range.   BUN 58 (H) 8 - 23 mg/dL    Comment: Please note change in reference range.   Creatinine, Ser 1.37 (H) 0.61 - 1.24 mg/dL   Calcium 10.3 8.9 - 10.3 mg/dL   Total Protein 5.6 (L) 6.5 - 8.1 g/dL   Albumin 2.6 (L) 3.5 - 5.0 g/dL   AST 1,305 (H) 15 - 41 U/L   ALT 498 (H) 0 - 44 U/L    Comment: Please note change in reference range.   Alkaline Phosphatase 757 (H) 38 - 126 U/L   Total Bilirubin 10.2 (H) 0.3 - 1.2 mg/dL    Comment: DELTA CHECK NOTED REPEATED TO VERIFY    GFR calc non Af Amer 49 (L) >60 mL/min   GFR calc Af Amer 57 (L) >60 mL/min    Comment: (NOTE) The eGFR has been calculated using the CKD EPI equation. This calculation has not been validated in all clinical situations. eGFR's persistently <60 mL/min signify possible Chronic Kidney Disease.    Anion gap NOT CALCULATED 5 - 15    Comment: Performed at [facility], [place] [address], [place], [place] [address]  CBC WITH DIFFERENTIAL     Status: Abnormal   Collection Time: 02/17/2018  9:28 PM  Result Value Ref Range   WBC 26.5 (H) 4.0 - 10.5 K/uL   RBC 5.22 4.22 - 5.81 MIL/uL   Hemoglobin 14.5 13.0 - 17.0 g/dL   HCT 48.9 39.0 - 52.0 %   MCV 93.7 78.0 - 100.0 fL    Comment: DELTA CHECK NOTED REPEATED TO VERIFY    MCH 27.8 26.0 - 34.0 pg   MCHC 29.7 (L) 30.0 - 36.0 g/dL   RDW 17.8 (H) 11.5 - 15.5 %   Platelets 152 150 - 400 K/uL   Neutrophils Relative % 83 %   Lymphocytes Relative 9 %   Monocytes Relative 7 %   Eosinophils Relative 0 %   Basophils Relative 1 %   Neutro Abs 21.9 (H) 1.7 - 7.7 K/uL   Lymphs Abs 2.4 0.7 - 4.0 K/uL   Monocytes Absolute 1.9 (H) 0.1 - 1.0 K/uL  Eosinophils Absolute 0.0 0.0 - 0.7 K/uL   Basophils Absolute 0.3 (H) 0.0 - 0.1 K/uL   RBC Morphology RARE NRBCs    WBC Morphology MILD LEFT SHIFT (1-5% METAS, OCC MYELO, OCC BANDS)     Comment:  Performed at [facility], [place] [address] [place], [place] [address]  TSH     Status: None   Collection Time: 02/04/2018  9:28 PM  Result Value Ref Range   TSH 0.861 0.350 - 4.500 uIU/mL    Comment: Performed by a 3rd Generation assay with a functional sensitivity of <=0.01 uIU/mL. Performed at [facility], [place] [address], [place], [place] [address]   Protime-INR     Status: Abnormal   Collection Time: 02/18/2018  9:33 PM  Result Value Ref Range   Prothrombin Time 29.1 (H) 11.4 - 15.2 seconds   INR 2.78     Comment: Performed at [facility], [place] [address], [place], [place] [address]  Acetaminophen level     Status: Abnormal   Collection Time: 02/08/2018  9:33 PM  Result Value Ref Range   Acetaminophen (Tylenol), Serum <10 (L) 10 - 30 ug/mL    Comment: (NOTE) Therapeutic concentrations vary significantly. A range of 10-30 ug/mL  may be an effective concentration for many patients. However, some  are best treated at concentrations outside of this range. Acetaminophen concentrations >150 ug/mL at 4 hours after ingestion  and >50 ug/mL at 12 hours after ingestion are often associated with  toxic reactions. Performed at [facility], [place] [address], [place], [place] [address]   Blood gas, venous     Status: Abnormal   Collection Time: 03/02/2018  9:40 PM  Result Value Ref Range   O2 Content 3.0 L/min   Delivery systems NASAL CANNULA    pH, Ven 6.884 (LL) 7.250 - 7.430    Comment: CRITICAL RESULT CALLED TO, READ BACK BY AND VERIFIED WITH: DOCTOR DAN FLOYD AT 2145 BY ANNALISSA AGUSTIN,RRT,RCP ON 02/23/2018    pCO2, Ven VALUE BELOW REPORTABLE RANGE 44.0 - 60.0 mmHg    Comment: CRITICAL RESULT CALLED TO, READ BACK BY AND VERIFIED WITH: DOCTOR DAN FLOYD AT 2145 BY ANNALISSA AGUSTIN,RRT,RCP ON 02/19/2018    pO2, Ven 92.1 (H) 32.0 - 45.0 mmHg   O2 Saturation 85.0 %   Patient temperature 95.3    Collection site VEIN     Sample type VENOUS     Comment: Performed at [facility], [place] [address] [place], [place] [address]   Dg Chest Portable 1 View  Result Date: 03/03/2018 CLINICAL DATA:  77 y/o  M; shortness of breath. EXAM: PORTABLE CHEST 1 VIEW COMPARISON:  11/22/2016 chest radiograph. FINDINGS: Stable normal cardiac silhouette given projection and technique. Low lung volumes accentuate pulmonary markings. No focal consolidation. No pleural effusion or pneumothorax. No acute osseous abnormality is evident. IMPRESSION: Low lung volumes.  No acute pulmonary process identified. Electronically Signed   By: Kristine Garbe M.D.   On: 02/19/2018 22:52    Pending Labs Unresulted Labs (From admission, onward)   Start     Ordered   02/16/2018 2106  T4  STAT,   STAT     02/04/2018 2105   03/03/2018 2050  Blood Culture (routine x 2)  BLOOD CULTURE X 2,   STAT     02/09/2018 2049   02/23/2018 2050  Urinalysis, Routine w reflex microscopic (not at [facility])  STAT,   STAT  03/01/2018 2049   02/14/2018 2050  Urine culture  STAT,   STAT     02/10/2018 2049      Vitals/Pain Today's Vitals   02/26/2018 2351 Feb 14, 2018 0000 2018/02/14 0005 02/14/18 0100  BP: 137/73 (!) 103/39 (!) 103/39 (!) 55/38  Pulse: (!) 108 (!) 106 (!) 113 (!) 106  Resp: (!) 27 18 (!) 26 (!) 25  Temp:      TempSrc:      SpO2: 97% 99% 97% 96%  Weight:      Height:      PainSc:        Isolation Precautions No active isolations  Medications Medications  sodium bicarbonate 150 mEq in dextrose 5 % 1,000 mL infusion ( Intravenous New Bag/Given 02/04/2018 2215)  traZODone (DESYREL) tablet 25 mg (has no administration in time range)  morphine CONCENTRATE 10 MG/0.5ML oral solution 5 mg (has no administration in time range)    Or  morphine CONCENTRATE 10 MG/0.5ML oral solution 5 mg (has no administration in time range)  HYDROmorphone (DILAUDID) injection 0.5 mg (has no administration in time range)  LORazepam (ATIVAN) tablet 1 mg ( Oral See  Alternative 02/13/2018 2359)    Or  LORazepam (ATIVAN) 2 MG/ML concentrated solution 1 mg ( Sublingual See Alternative 02/17/2018 2359)    Or  LORazepam (ATIVAN) injection 1 mg (1 mg Intravenous Given 02/16/2018 2359)  diphenhydrAMINE (BENADRYL) injection 12.5 mg (has no administration in time range)  senna (SENOKOT) tablet 8.6 mg (has no administration in time range)  bisacodyl (DULCOLAX) suppository 10 mg (has no administration in time range)  ondansetron (ZOFRAN-ODT) disintegrating tablet 4 mg (has no administration in time range)    Or  ondansetron (ZOFRAN) injection 4 mg (has no administration in time range)  pantoprazole (PROTONIX) EC tablet 40 mg (has no administration in time range)  polyvinyl alcohol (LIQUIFILM TEARS) 1.4 % ophthalmic solution 1 drop (has no administration in time range)  sodium chloride 0.9 % bolus 1,000 mL (0 mLs Intravenous Stopped 02/26/2018 2222)    And  sodium chloride 0.9 % bolus 1,000 mL (0 mLs Intravenous Stopped 02/13/2018 2256)  piperacillin-tazobactam (ZOSYN) IVPB 3.375 g (0 g Intravenous Stopped 02/07/2018 2159)  vancomycin (VANCOCIN) IVPB 1000 mg/200 mL premix (0 mg Intravenous Stopped 02/15/2018 2241)  ondansetron (ZOFRAN) injection 4 mg (4 mg Intravenous Given 02/18/2018 2140)  promethazine (PHENERGAN) injection 12.5 mg (12.5 mg Intravenous Given 02/28/2018 2301)    Mobility non-ambulatory

## 2018-03-06 NOTE — Discharge Summary (Signed)
Death Summary  MARTIN BELLING XTG:[id number] DOB: Dec 12, 1940 DOA: 02/23/2018  PCP: [name], MD  Admit date: 2018/02/23 Date of Death: 20-Mar-2018 Time of Death:   Notification: [name], MD notified of death of 03/20/18   History of present illness:  JUSIAH AGUAYO is a 77 y.o. male with a history of melanoma with metastatic spread to the liver, diabetes, hypertension, gout   Sanjuana Mae presented with complaint of abdominal pain and confusion HUY MAJID was found to have fulminant hepatic failure felt to be secondary to possibly metastatic melanoma versus immunotherapy patient became rapidly comatose according to patient's wishes and family wishes he was made comfort care only and passed away   Final Diagnoses:  1.   Fulminant hepatic failure 2.  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  Diabetes mellitus 4.  Hypertension  The results of significant diagnostics from this hospitalization (including imaging, microbiology, ancillary and laboratory) are listed below for reference.    Significant Diagnostic Studies: Ct Abdomen Pelvis W Contrast  Result Date: 01/31/2018 CLINICAL DATA:  History of melanoma, receiving Nivolumab, recent elevation in liver function tests. EXAM: CT ABDOMEN AND PELVIS WITH CONTRAST TECHNIQUE: Multidetector CT imaging of the abdomen and pelvis was performed using the standard protocol following bolus administration of intravenous contrast. CONTRAST:  136mL ISOVUE-300 IOPAMIDOL (ISOVUE-300) INJECTION 61% COMPARISON:  None. FINDINGS: Lower chest: Three-vessel coronary artery atherosclerosis. Fluid density crescentic structure adjacent to the distal esophagus at the hiatus on image 19/2, measuring 1.7 by 1.6 cm, significance uncertain. Hepatobiliary: There is diffuse nodularity of the liver compatible with cirrhosis or pseudo cirrhosis. Borderline gallbladder wall thickening. Pancreas: Unremarkable Spleen: Extensive hypodense nodularity of variable size lesions throughout  the spleen. An index lesion inferiorly in the spleen measures 2.8 by 2.4 cm on image 34/2. Adrenals/Urinary Tract: The adrenal glands appear normal. Two dominant fluid density cysts of the right kidney are noted. Stomach/Bowel: Descending and sigmoid colon diverticulosis without compelling findings of metastatic disease to bowel. Appendix normal. Vascular/Lymphatic: Aortoiliac atherosclerotic vascular disease. A porta hepatis lymph node just above the pancreas measures 1.2 cm in short axis on image 34/2, and could be reactive or malignant. Reproductive: The prostate gland measures 5.7 by 4.7 by 4.7 cm (volume = 66 cm^3). There is subcutaneous edema in the pubis extending into the scrotum and potentially the penis. Other: Trace fluid in the right paracolic gutter. Small amount of free pelvic fluid, image 83/2. Musculoskeletal: The confluent edema along the pubis also extends to overlie the right hip region laterally. 30% superior endplate compression fracture at T12, potentially chronic or late subacute. Lower lumbar degenerative facet arthropathy. IMPRESSION: 1. Diffuse low-density nodularity throughout the liver along with variable-sized diffuse low-density nodularity in the spleen. 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 Alternatively this could be true cirrhosis with either splenic metastatic disease or other causes of multiple splenic nodules to include lymphoid neoplasm, Littoral cell angioma, or less likely sarcoidosis. Workup pathways may include tissue diagnosis/biopsy or nuclear medicine PET-CT. 2. Mildly prominent porta hepatis lymph node could be reactive or neoplastic. 3. Abnormal confluent subcutaneous edema along the pubis extending into the scrotum and penis, cause uncertain. Subcutaneous edema also extends lateral to the right hip. 4. Small amount of free pelvic fluid, etiology uncertain. 5. Borderline gallbladder wall thickening. 6.   Aortic Atherosclerosis (ICD10-I70.0). 7. Mild prostatomegaly. 8. 30% superior endplate compression fracture of T12, probably chronic or late subacute. Electronically Signed   By: Van Clines  M.D.   On: 01/31/2018 16:16   Dg Chest Portable 1 View  Result Date: 02/08/2018 CLINICAL DATA:  77 y/o  M; shortness of breath. EXAM: PORTABLE CHEST 1 VIEW COMPARISON:  11/22/2016 chest radiograph. FINDINGS: Stable normal cardiac silhouette given projection and technique. Low lung volumes accentuate pulmonary markings. No focal consolidation. No pleural effusion or pneumothorax. No acute osseous abnormality is evident. IMPRESSION: Low lung volumes.  No acute pulmonary process identified. Electronically Signed   By: Kristine Garbe M.D.   On: 03/02/2018 22:52    Microbiology: No results found for this or any previous visit (from the past 240 hour(s)).   Labs: Basic Metabolic Panel: No results for input(s): NA, K, CL, CO2, GLUCOSE, BUN, CREATININE, CALCIUM, MG, PHOS in the last 168 hours. Liver Function Tests: No results for input(s): AST, ALT, ALKPHOS, BILITOT, PROT, ALBUMIN in the last 168 hours. No results for input(s): LIPASE, AMYLASE in the last 168 hours. No results for input(s): AMMONIA in the last 168 hours. CBC: No results for input(s): WBC, NEUTROABS, HGB, HCT, MCV, PLT in the last 168 hours. Cardiac Enzymes: No results for input(s): CKTOTAL, CKMB, CKMBINDEX, TROPONINI in the last 168 hours. D-Dimer No results for input(s): DDIMER in the last 72 hours. BNP: Invalid input(s): POCBNP CBG: No results for input(s): GLUCAP in the last 168 hours. Anemia work up No results for input(s): VITAMINB12, FOLATE, FERRITIN, TIBC, IRON, RETICCTPCT in the last 72 hours. Urinalysis    Component Value Date/Time   COLORURINE AMBER (A) 01/31/2018 1759   APPEARANCEUR CLEAR 01/31/2018 1759   LABSPEC 1.045 (H) 01/31/2018 1759   PHURINE 5.0 01/31/2018 1759   GLUCOSEU NEGATIVE 01/31/2018 1759    HGBUR SMALL (A) 01/31/2018 1759   BILIRUBINUR MODERATE (A) 01/31/2018 1759   KETONESUR 20 (A) 01/31/2018 1759   PROTEINUR NEGATIVE 01/31/2018 1759   NITRITE NEGATIVE 01/31/2018 1759   LEUKOCYTESUR NEGATIVE 01/31/2018 1759   Sepsis Labs Invalid input(s): PROCALCITONIN,  WBC,  LACTICIDVEN     SIGNED:  Toy Baker, MD  [facility] 03/02/2018, 12:54 AM Pager   If 7PM-7AM, please contact night-coverage [email or web address] Password [email or web address]

## 2018-03-06 DEATH — deceased

## 2018-03-31 ENCOUNTER — Encounter (HOSPITAL_COMMUNITY): Payer: Medicare Other

## 2018-03-31 ENCOUNTER — Ambulatory Visit: Payer: Medicare Other | Admitting: Surgery

## 2018-06-23 IMAGING — US US BIOPSY FNA W/ IMAGING
1 series · 7 of 7 positions shown · non-contrast
Comparison: none

[Series 1: us biopsy fna w/ imaging · 0.05mm/px · 7 of 7 slices shown]
[im 1/7]
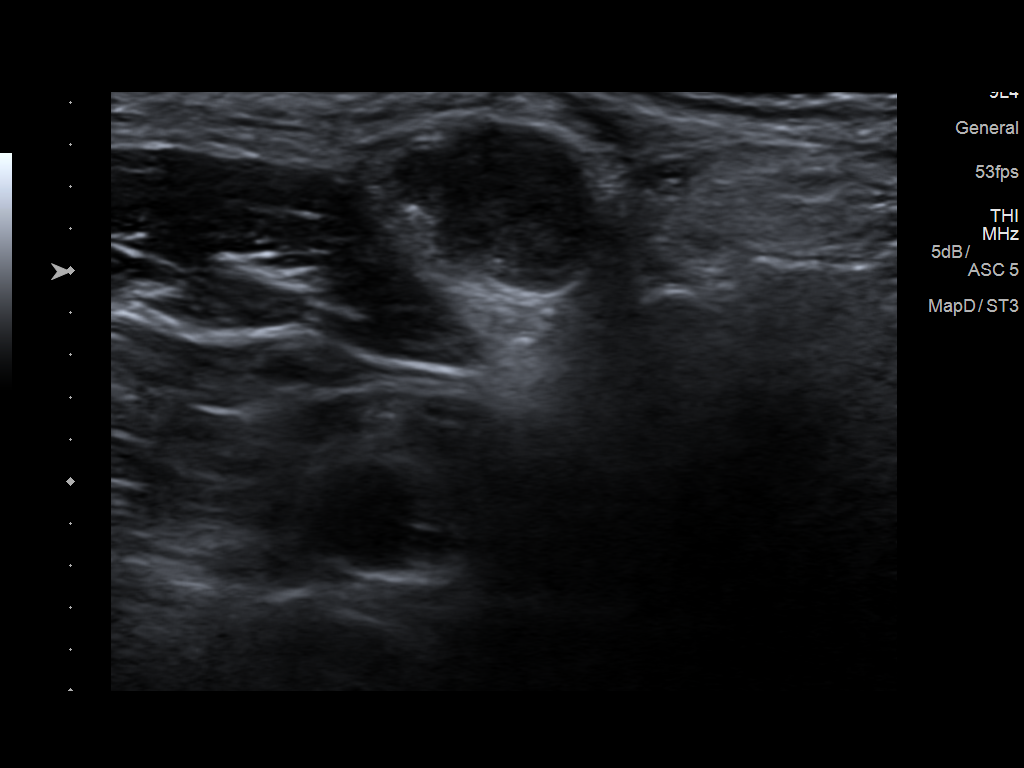
[im 2/7]
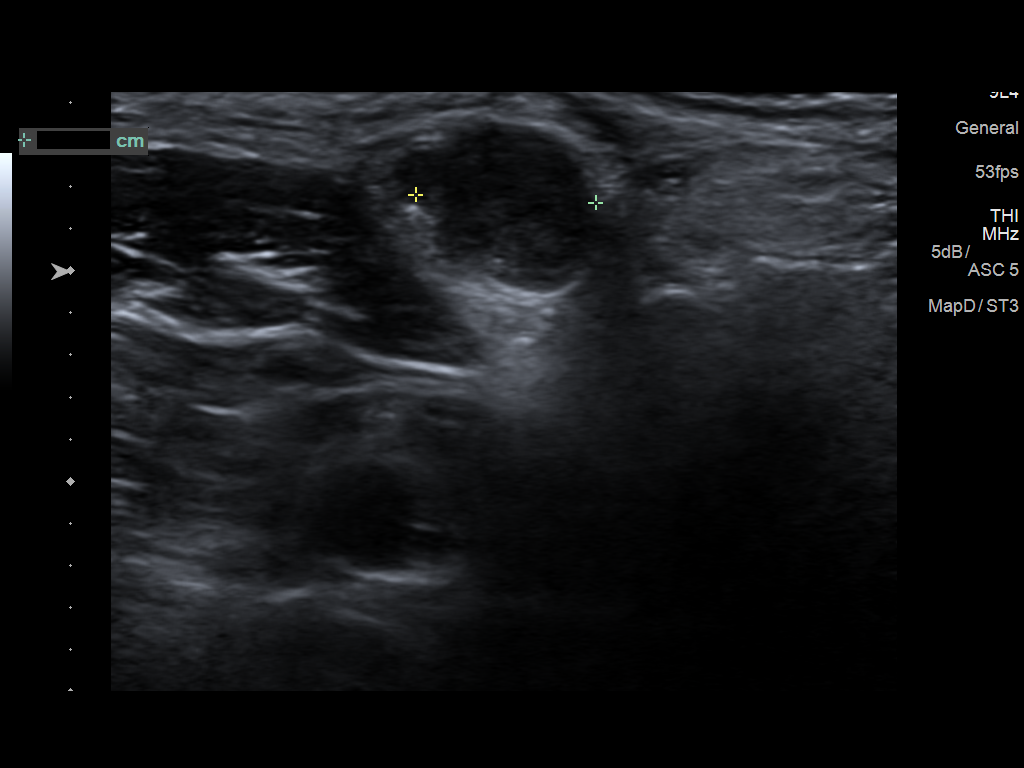
[im 3/7]
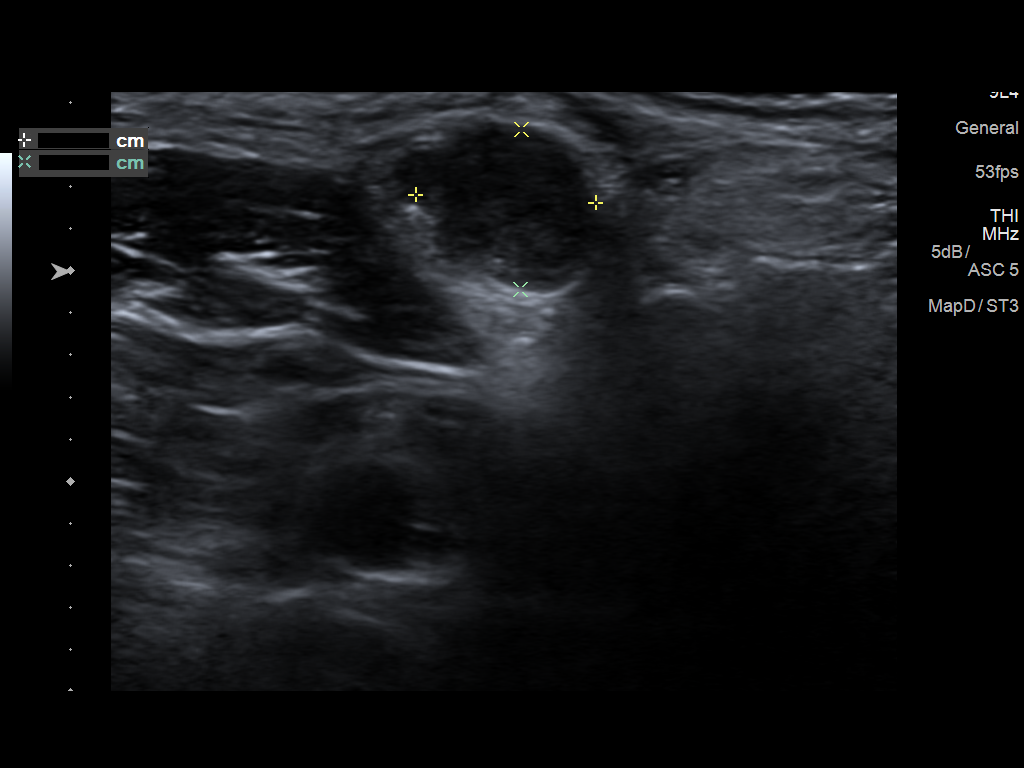
[im 4/7]
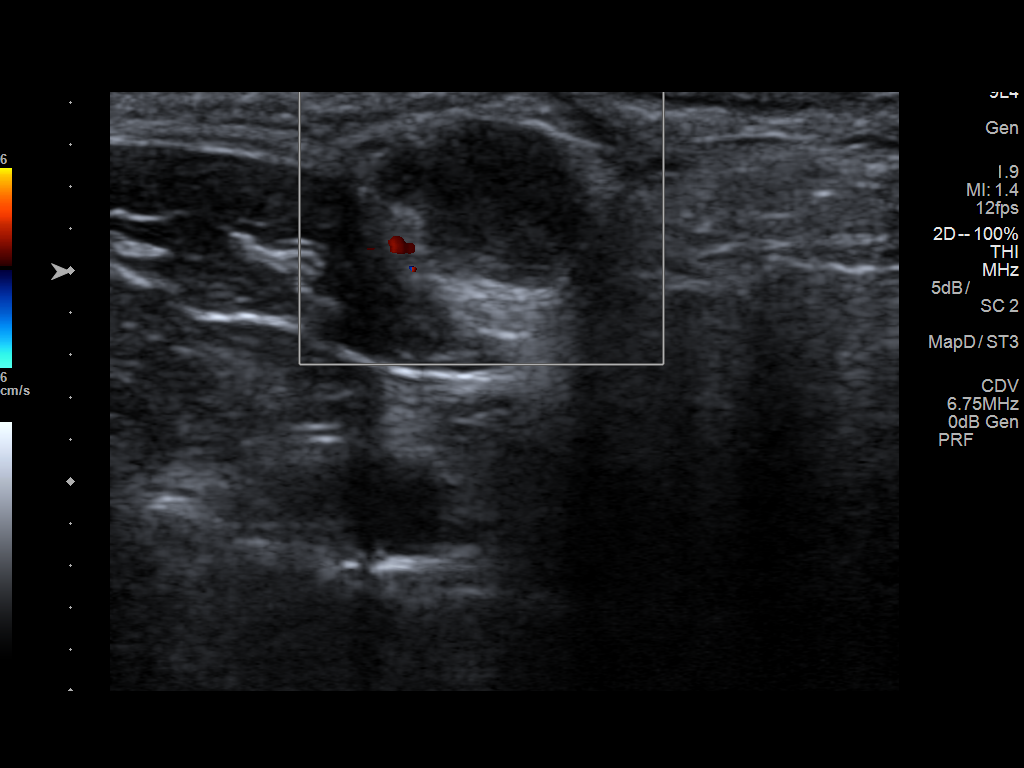
[im 5/7]
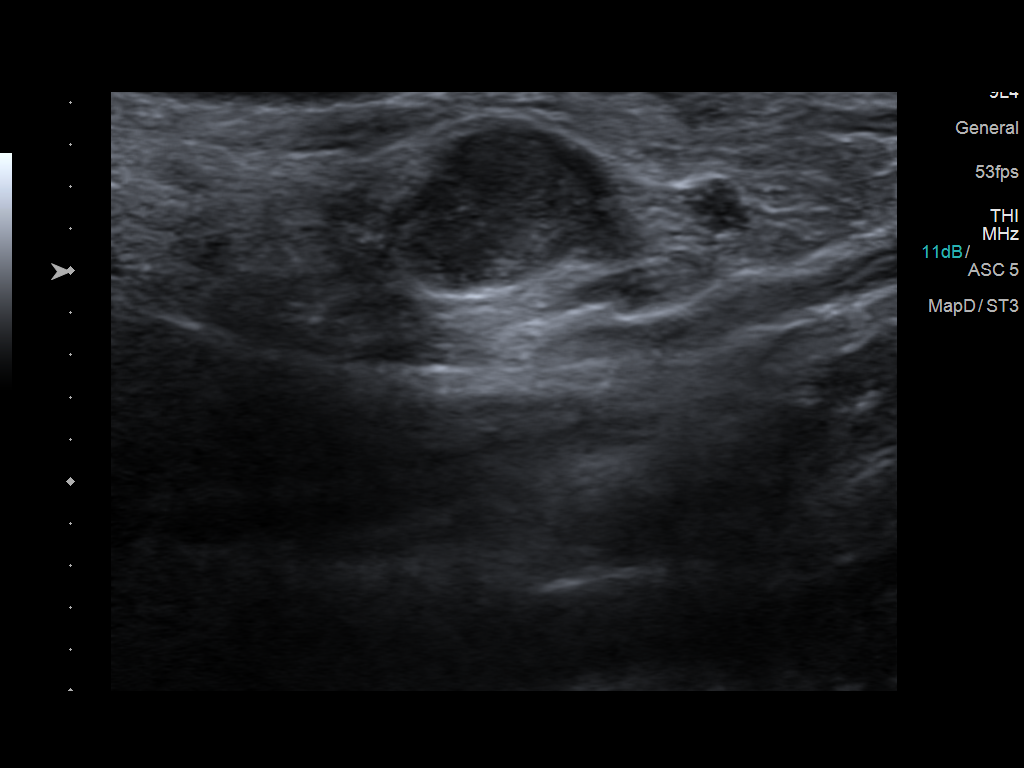
[im 6/7]
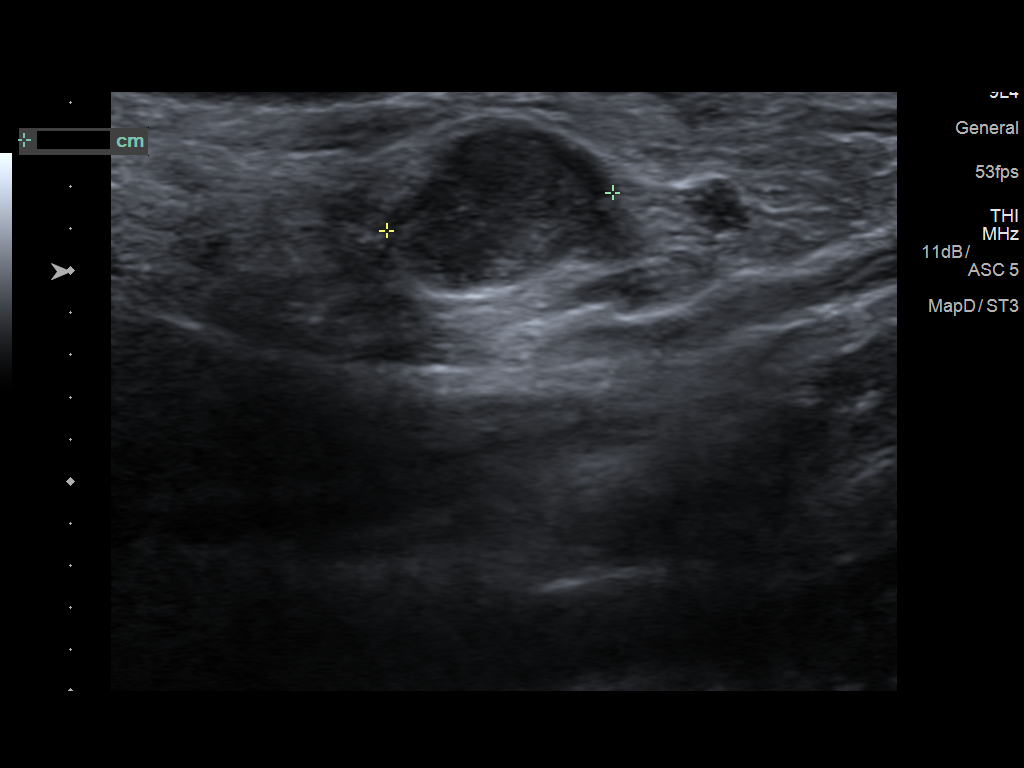
[im 7/7]
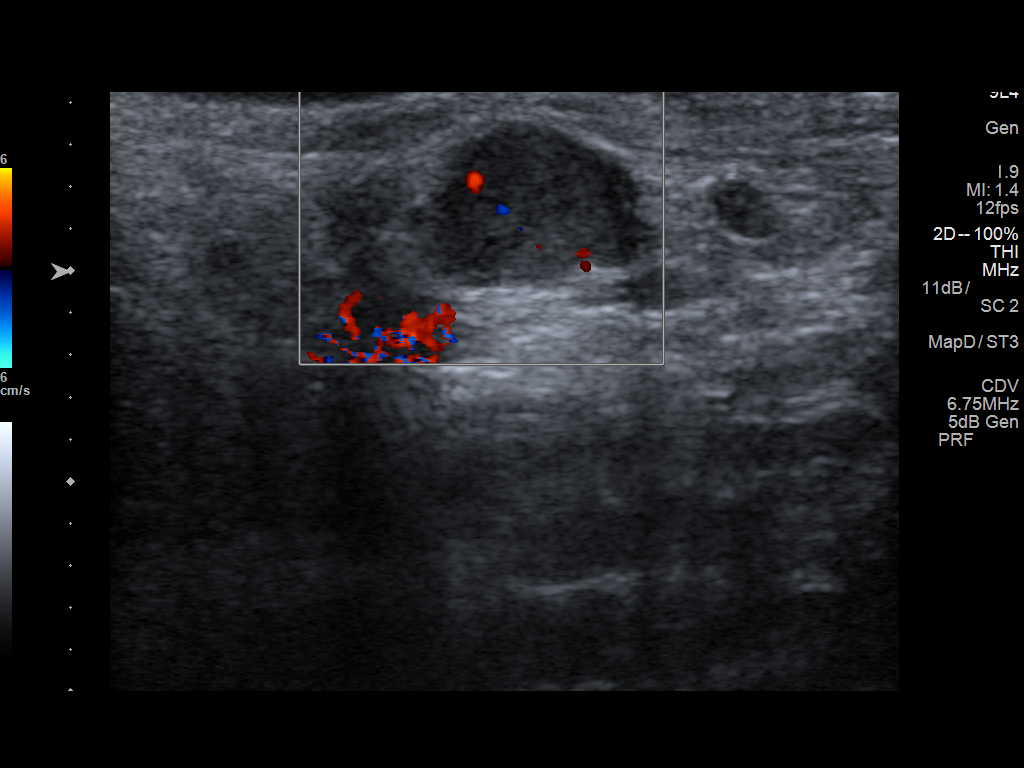

[7 of 7 positions shown; findings below may reference images not displayed]

Canned report from images found in remote index.

Refer to host system for actual result text.
